# Patient Record
Sex: Female | Born: 1950
Health system: Southern US, Community
[De-identification: ages and names within clinical notes are randomized; demographics above are authoritative.]

## PROBLEM LIST (undated history)

## (undated) DIAGNOSIS — D333 Benign neoplasm of cranial nerves: Secondary | ICD-10-CM

## (undated) DIAGNOSIS — J302 Other seasonal allergic rhinitis: Secondary | ICD-10-CM

## (undated) DIAGNOSIS — N183 Chronic kidney disease, stage 3 unspecified: Secondary | ICD-10-CM

## (undated) DIAGNOSIS — R251 Tremor, unspecified: Secondary | ICD-10-CM

## (undated) DIAGNOSIS — K603 Anal fistula, unspecified: Secondary | ICD-10-CM

## (undated) DIAGNOSIS — N83202 Unspecified ovarian cyst, left side: Secondary | ICD-10-CM

## (undated) DIAGNOSIS — H269 Unspecified cataract: Secondary | ICD-10-CM

## (undated) DIAGNOSIS — R531 Weakness: Secondary | ICD-10-CM

## (undated) DIAGNOSIS — Z86010 Personal history of colonic polyps: Secondary | ICD-10-CM

## (undated) DIAGNOSIS — M109 Gout, unspecified: Secondary | ICD-10-CM

## (undated) DIAGNOSIS — G500 Trigeminal neuralgia: Secondary | ICD-10-CM

## (undated) DIAGNOSIS — I10 Essential (primary) hypertension: Secondary | ICD-10-CM

## (undated) DIAGNOSIS — H9191 Unspecified hearing loss, right ear: Secondary | ICD-10-CM

## (undated) DIAGNOSIS — E785 Hyperlipidemia, unspecified: Secondary | ICD-10-CM

## (undated) DIAGNOSIS — R2689 Other abnormalities of gait and mobility: Secondary | ICD-10-CM

## (undated) DIAGNOSIS — T7840XA Allergy, unspecified, initial encounter: Secondary | ICD-10-CM

## (undated) DIAGNOSIS — F419 Anxiety disorder, unspecified: Secondary | ICD-10-CM

## (undated) DIAGNOSIS — R32 Unspecified urinary incontinence: Secondary | ICD-10-CM

## (undated) DIAGNOSIS — K219 Gastro-esophageal reflux disease without esophagitis: Secondary | ICD-10-CM

## (undated) DIAGNOSIS — E079 Disorder of thyroid, unspecified: Secondary | ICD-10-CM

## (undated) DIAGNOSIS — K589 Irritable bowel syndrome without diarrhea: Secondary | ICD-10-CM

## (undated) HISTORY — DX: Other abnormalities of gait and mobility: R26.89

## (undated) HISTORY — DX: Personal history of colonic polyps: Z86.010

## (undated) HISTORY — DX: Gastro-esophageal reflux disease without esophagitis: K21.9

## (undated) HISTORY — PX: CATARACT EXTRACTION W/ INTRAOCULAR LENS  IMPLANT, BILATERAL: SHX1307

## (undated) HISTORY — PX: COLONOSCOPY WITH PROPOFOL: SHX5780

## (undated) HISTORY — DX: Unspecified cataract: H26.9

## (undated) HISTORY — DX: Allergy, unspecified, initial encounter: T78.40XA

## (undated) HISTORY — PX: WISDOM TOOTH EXTRACTION: SHX21

## (undated) HISTORY — DX: Unspecified hearing loss, right ear: H91.91

## (undated) HISTORY — PX: OTHER SURGICAL HISTORY: SHX169

## (undated) HISTORY — PX: MOUTH SURGERY: SHX715

## (undated) HISTORY — DX: Essential (primary) hypertension: I10

## (undated) HISTORY — DX: Gout, unspecified: M10.9

## (undated) HISTORY — DX: Anxiety disorder, unspecified: F41.9

## (undated) HISTORY — DX: Unspecified urinary incontinence: R32

## (undated) HISTORY — DX: Hyperlipidemia, unspecified: E78.5

## (undated) HISTORY — DX: Disorder of thyroid, unspecified: E07.9

---

## 1998-12-17 ENCOUNTER — Encounter: Admission: RE | Admit: 1998-12-17 | Discharge: 1998-12-17 | Payer: Self-pay | Admitting: Obstetrics and Gynecology

## 1998-12-17 ENCOUNTER — Encounter: Payer: Self-pay | Admitting: Obstetrics and Gynecology

## 1999-12-19 ENCOUNTER — Encounter: Admission: RE | Admit: 1999-12-19 | Discharge: 1999-12-19 | Payer: Self-pay | Admitting: Obstetrics and Gynecology

## 1999-12-19 ENCOUNTER — Encounter: Payer: Self-pay | Admitting: Obstetrics and Gynecology

## 2000-12-19 ENCOUNTER — Encounter: Admission: RE | Admit: 2000-12-19 | Discharge: 2000-12-19 | Payer: Self-pay | Admitting: Obstetrics and Gynecology

## 2000-12-19 ENCOUNTER — Encounter: Payer: Self-pay | Admitting: Obstetrics and Gynecology

## 2001-12-20 ENCOUNTER — Encounter: Admission: RE | Admit: 2001-12-20 | Discharge: 2001-12-20 | Payer: Self-pay | Admitting: Obstetrics and Gynecology

## 2001-12-20 ENCOUNTER — Encounter: Payer: Self-pay | Admitting: Obstetrics and Gynecology

## 2008-01-17 ENCOUNTER — Encounter: Admission: RE | Admit: 2008-01-17 | Discharge: 2008-01-17 | Payer: Self-pay | Admitting: Otolaryngology

## 2008-08-20 ENCOUNTER — Inpatient Hospital Stay (HOSPITAL_COMMUNITY): Admission: RE | Admit: 2008-08-20 | Discharge: 2008-08-24 | Payer: Self-pay | Admitting: Neurological Surgery

## 2008-08-20 ENCOUNTER — Encounter (INDEPENDENT_AMBULATORY_CARE_PROVIDER_SITE_OTHER): Payer: Self-pay | Admitting: Neurological Surgery

## 2008-08-20 DIAGNOSIS — D333 Benign neoplasm of cranial nerves: Secondary | ICD-10-CM

## 2008-08-20 HISTORY — DX: Benign neoplasm of cranial nerves: D33.3

## 2008-09-21 ENCOUNTER — Encounter: Admission: RE | Admit: 2008-09-21 | Discharge: 2008-09-21 | Payer: Self-pay | Admitting: Family Medicine

## 2010-05-29 LAB — TYPE AND SCREEN

## 2010-05-29 LAB — POCT I-STAT 4, (NA,K, GLUC, HGB,HCT)
Glucose, Bld: 141 mg/dL — ABNORMAL HIGH (ref 70–99)
HCT: 40 % (ref 36.0–46.0)
Hemoglobin: 13.6 g/dL (ref 12.0–15.0)
Potassium: 3 mEq/L — ABNORMAL LOW (ref 3.5–5.1)

## 2010-05-29 LAB — ABO/RH: ABO/RH(D): O POS

## 2010-05-30 LAB — BASIC METABOLIC PANEL
BUN: 18 mg/dL (ref 6–23)
Calcium: 9.7 mg/dL (ref 8.4–10.5)
Creatinine, Ser: 1.15 mg/dL (ref 0.4–1.2)
GFR calc Af Amer: 59 mL/min — ABNORMAL LOW (ref >60)

## 2010-05-30 LAB — CBC
Platelets: 238 10*3/uL (ref 150–400)
RBC: 4.78 MIL/uL (ref 3.87–5.11)
WBC: 8.2 10*3/uL (ref 4.0–10.5)

## 2010-07-05 NOTE — Op Note (Signed)
Doris French, Doris French                ACCOUNT NO.:  [id number]   MEDICAL RECORD NO.:  [id number]          PATIENT TYPE:  INP   LOCATION:  3109                         FACILITY:  [facility]   PHYSICIAN:  Newman Pies, MD            DATE OF BIRTH:  23-Dec-1950   DATE OF PROCEDURE:  08/20/2008  DATE OF DISCHARGE:                               OPERATIVE REPORT   SURGEON:  Newman Pies, MD   CO-SURGEON:  Stefani Dama, MD   PREOPERATIVE DIAGNOSIS:  Right cerebellopontine angle tumor.   POSTOPERATIVE DIAGNOSIS:  Right cerebellopontine angle tumor.   PROCEDURE PERFORMED:  1. Excision of the right cerebellopontine angle tumor.  2. Microscope microdiessection.  3. NIMS facial nerve monitoring.   ANESTHESIA:  General endotracheal tube anesthesia.   COMPLICATIONS:  None.   INDICATIONS FOR PROCEDURE:  The patient is a 60 year old female with a  history of an asymmetric right-sided sensorineural hearing loss and  tinnitus.  An MRI scan showed right-sided cerebellopontine angle tumor,  likely an acoustic neuroma.  The tumor was noted to have an  extracanalicular and intracanalicular components.  According to the  patient, her right-sided hearing loss has progressively worsened.  Based  on the above findings, the decision was made to perform excision of the  tumor.  The alternative of radiation therapy and conservative  observation were also discussed.  The patient is interested in  proceeding with the surgical procedure.  The procedure is performed in  conjunction with Dr. Barnett Abu, neurosurgeon.   DESCRIPTION:  The patient was taken to the operating room and placed  supine on the operating table.  The initial part of the procedure was  performed by Dr. Danielle Dess.  It included the exposure of the posterior  cranial fossa via retrosigmoid craniotomy.  The posterior cranial fossa  was exposed, revealing a mass occupying the superior and central portion  of the porus acusticus.  The mass was noted to be  significantly fibrotic  and adherent to the posterior cranial fossa.  Part of the posterior  cranial fossa anterior to the porus acusticus was noted to be eroded by  the tumor.  A portion of the tumor was removed and sent to the pathology  department for frozen section analysis.  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  However, definitive diagnosis could not be made  until the immunohistologic staining was performed.  The dura overlying  the posterior cranial fossa was then carefully dissected free from its  bony attachment.  Using a 3-0 diamond bur, the bony encasement of the  internal auditory canal was carefully drilled out.  As noted earlier,  portion of the posterior cranial fossa anterior to the internal auditory  canal was eroded by the tumor.  The tumor was then carefully dissected  free from the surrounding dura tissue.  The facial nerve was  subsequently identified and preserved.  The tumor was carefully  dissected free from the facial nerve.  However, most of the cranial  nerve #8 was noted to be adherent to the tumor.  The entire tumor was  subsequently removed from both the intracanalicular and extracanalicular  portions.  The specimen was sent to the Pathology Department for  permanent histologic identification.  Please see Dr. Verlee Rossetti dictation  regarding surgical closure of the dura and incisions.   OPERATIVE FINDINGS:  Fibrous and erosive tumor was noted within the  internal auditory canal as well as the posterior cranial fossa.  The  tumor was resected and sent to the Pathology Department for permanent  histologic identification.   SPECIMEN:  Right cerebellopontine angle tumor.   FOLLOWUP CARE:  The patient will be admitted to the Neurosurgical  Intensive Care Unit for postop hospital care.      Newman Pies, MD  Electronically Signed     ST/MEDQ  D:  08/20/2008  T:  08/21/2008  Job:  [id number]

## 2010-07-05 NOTE — Op Note (Signed)
Doris French, CHANCELLOR                ACCOUNT NO.:  [id number]   MEDICAL RECORD NO.:  [id number]          PATIENT TYPE:  INP   LOCATION:  3109                         FACILITY:  [facility]   PHYSICIAN:  Stefani Dama, M.D.  DATE OF BIRTH:  09/01/1950   DATE OF PROCEDURE:  08/20/2008  DATE OF DISCHARGE:                               OPERATIVE REPORT   PREOPERATIVE DIAGNOSIS:  Right internal acoustic meatus tumor.   POSTOPERATIVE DIAGNOSIS:  Right internal acoustic meatus tumor.   PROCEDURES:  Right suboccipital craniectomy, gross total resection of  tumor with drilling of the internal acoustic meatus.   SURGEON:  Stefani Dama, MD   CO-SURGEON:  Newman Pies, MD   ASSISTANT:  Coletta Memos, MD   ANESTHESIA:  General endotracheal.   INDICATIONS:  Doris French is a 60 year old individual who has had  significant hearing loss in her right ear.  She was found to have a  tumor in the internal acoustic meatus with extension intracranially and  this was felt to be representative of a schwannoma; however, the  diagnosis also includes differential of meningioma.  She is taken to the  operating room at this time to undergo surgical resection.   PROCEDURE:  The patient was brought to the operating room supine on the  stretcher after smooth induction of general endotracheal anesthesia and  placement of appropriate monitoring lines including seventh and eighth  nerve monitoring.  She was placed in a three-pin headrest, turned into  the park position on the right side up, the left side down, and the  suboccipital region was then prepped with alcohol and DuraPrep after  shaving appropriate area of skin with clippers.  The patient was then  prepped and draped sterilely and a vertical incision was made in the  right retromastoid region, carried down to the occiput until the region  of the mastoid air cells could be identified.  Once these was identified  and the tissue was stripped from the bone in  this area, high-speed drill  was used to create a craniectomy in the corner of the mastoid at the  region of the junction of the transverse and sigmoid sinuses.  The  craniectomy was enlarged.  When the sigmoid sinus was clearly  identified, the mastoid air cells were packed with bone wax and then the  dura was opened in a T-shape with a short leg of the T being based into  the tangle of the transverse and sigmoid sinus junction.  The dura was  tacked up and the lateral aspect of the cerebellum was then retracted  medially after placement of a Budde Halo retractor system and the area  was then inspected under microscope.  With microscopic visualization,  Dr. Suszanne Conners and I could see that the area where we would expect to find the  internal acoustic meatus was enlarged with a mass.  Mass was adherent to  it.  Careful inspection at this point was somewhat confusing as the area  of the fifth nerve could not be identified.  At this point, I asked Dr.  Franky Macho to be  present to help with some localization and with further  retraction along the inferior aspect of the mass, we could identify what  we felt was the seventh and eighth nerve complex.  Ultimately  stimulation of this yielded the seventh nerve stimulation; however,  brainstem auditory evoke responses were never able to be obtained even  with the patient awake prior to being put to sleep and were not present  once the patient was asleep, therefore brain stem auditory evoked  monitoring was not performed.  Further dissection then with the seventh  nerve identified positively identified the ninth, tenth, and eleventh  nerve complex and this was carefully protected with cottonoid patty.  Spinal fluid was being evacuated to allow retraction of the lateral  aspect of the cerebellum and we could see the lateral aspect of the  brain stem.  The fifth nerve, however, could not be identified as it was  obscured by the mass from the internal auditory  meatus.  At this point,  biopsy of some soft tissue attached to the mass was obtained and this  was returned as a spindle cell tumor with the seventh and eighth nerve  complex being identified.  Dr. Suszanne Conners started to drill the portion of the  internal acoustic meatus.  This released some lateral tumor and  gradually we were able to mobilize a portion of the tumor from the  posterior and superior portion of the meatus.  I resected this using  microdissection technique and further drilling of the acoustic meatus  was performed by Dr. Suszanne Conners.  It was felt because the tumor was very  fibrous and very densely adherent to the bone that we may not be able to  remove it and decompress fully the seventh nerve; however, once the  internal auditory meatus was drilled away, I was able to tease some of  the tumor from the outside that is the intracranial portion and then by  gently dissecting around the bony border of the internal auditory  meatus, a large portion of tumor released itself in a singular piece.  This included a piece extended into the internal auditory meatus and  with lifting and removal of this tumor, we could see clearly the path of  the seventh nerve, which was intact.  We stimulated the seventh nerve,  which responded briskly and at this point further exploration yielded  some small packs of tumor in the internal auditory meatus, which were  removed with a simple forceps and further exploration found that the  external portion of the tumor that is tumor that was intracranial was  removed completely and no other tags were identified.  With this, the  wound was irrigated copiously.  Hemostasis from the bone, which had some  bleeding and it was obtained with some application of Gelfoam pledgets,  which were later irrigated away.  In the end, we had good stimulation of  the seventh nerve and a gross total resection of the tumor and at this  point we could further identify beyond the mass  that the fifth nerve was  intact and easily identifiable at this point.  The retractors were then  removed.  Careful hemostasis was obtained in all the intracranial  aspects and the dura was reapproximated with singular 4-0 Nurolon  sutures and DuraGen was placed in a sandwich configuration on either  side of the dura.  The suboccipital region was then closed with 2-0  Vicryl in interrupted fashion in the retromastoid fascia and tissue  and  the subcutaneous skin was closed with 2-0 Vicryl and 3-0 nylon was used  to close the skin.  The patient tolerated the procedure well.  Blood  loss was estimated at 350 mL.  She was returned to recovery room in  stable condition.      Stefani Dama, M.D.  Electronically Signed     Stefani Dama, M.D.  Electronically Signed    HJE/MEDQ  D:  08/20/2008  T:  08/21/2008  Job:  [id number]

## 2010-07-05 NOTE — Discharge Summary (Signed)
Doris French, Doris French                ACCOUNT NO.:  [id number]   MEDICAL RECORD NO.:  [id number]          PATIENT TYPE:  INP   LOCATION:  3040                         FACILITY:  [facility]   PHYSICIAN:  Payton Doughty, M.D.      DATE OF BIRTH:  01/06/1951   DATE OF ADMISSION:  08/20/2008  DATE OF DISCHARGE:  08/24/2008                               DISCHARGE SUMMARY   ADMITTING DIAGNOSIS:  Right acoustic neuroma.   DISCHARGE DIAGNOSIS:  Right acoustic neuroma.   PROCEDURE:  Right retromastoid suboccipital craniectomy for meningioma.   ATTENDING DOCTOR:  Stefani Dama, MD   COMPLICATIONS:  None.   DISCHARGE STATUS:  Alive and well.   BODY OF TEXT:  A 60 year old right-handed white girl's history and  physical is recounted in the chart.  She enjoys good health.  Acoustic  neuroma was discovered.  She was followed by Dr. Danielle Dess.  On August 20, 2008, she was taken by Dr. Danielle Dess to the OR and Dr. Suszanne Conners and underwent a  suboccipital craniectomy for tumor resection.  Postoperatively, she has  done well.  She experienced some vertigo.  She had no CSF leak.  She was  in the unit for couple of days, then was out, and still had some  vertigo, so she was started on physical therapy, we got her a rolling  walker, which has markedly improved her walking.  Currently, she is up  and about.  She has no dizziness.  She has a walker for use at home.  She is going to see Dr. Danielle Dess back in a couple of weeks and is using a  little bit of Lortab for pain.           ______________________________  Payton Doughty, M.D.     MWR/MEDQ  D:  08/24/2008  T:  08/25/2008  Job:  [id number]

## 2011-09-22 ENCOUNTER — Encounter: Payer: Self-pay | Admitting: Internal Medicine

## 2011-09-22 ENCOUNTER — Telehealth: Payer: Self-pay | Admitting: *Deleted

## 2011-09-22 NOTE — Telephone Encounter (Signed)
Patient had a colonoscopy in 11/2001.  I have scheduled her a colon foro 11/15/11  And pre-visit for 10/31/11

## 2011-11-06 ENCOUNTER — Ambulatory Visit (AMBULATORY_SURGERY_CENTER): Payer: Medicare Other | Admitting: *Deleted

## 2011-11-06 VITALS — Ht 65.25 in | Wt 212.1 lb

## 2011-11-06 DIAGNOSIS — Z1211 Encounter for screening for malignant neoplasm of colon: Secondary | ICD-10-CM

## 2011-11-06 MED ORDER — NA SULFATE-K SULFATE-MG SULF 17.5-3.13-1.6 GM/177ML PO SOLN: 1.0000 | Freq: Once | ORAL | Status: DC

## 2011-11-07 ENCOUNTER — Encounter: Payer: Self-pay | Admitting: Internal Medicine

## 2011-11-13 ENCOUNTER — Telehealth: Payer: Self-pay

## 2011-11-13 MED ORDER — NA SULFATE-K SULFATE-MG SULF 17.5-3.13-1.6 GM/177ML PO SOLN: ORAL | Status: DC

## 2011-11-13 NOTE — Telephone Encounter (Signed)
Pt cannot afford suprep, cost$ 72.84.  I have put her a sample up front for pick up.  She said thanks.

## 2011-11-15 ENCOUNTER — Ambulatory Visit (AMBULATORY_SURGERY_CENTER): Payer: Medicare Other | Admitting: Internal Medicine

## 2011-11-15 ENCOUNTER — Encounter: Payer: Self-pay | Admitting: Internal Medicine

## 2011-11-15 VITALS — BP 140/79 | HR 78 | Temp 98.2°F | Resp 16 | Ht 65.0 in | Wt 212.0 lb

## 2011-11-15 DIAGNOSIS — D126 Benign neoplasm of colon, unspecified: Secondary | ICD-10-CM

## 2011-11-15 DIAGNOSIS — Z1211 Encounter for screening for malignant neoplasm of colon: Secondary | ICD-10-CM

## 2011-11-15 MED ORDER — SODIUM CHLORIDE 0.9 % IV SOLN: 500.0000 mL | INTRAVENOUS | Status: DC

## 2011-11-15 NOTE — Patient Instructions (Addendum)
Two benign-appearing polyps were removed today. The colon was otherwise normal. Great job on the prep!  I will send you a letter about pathology results and recommendations.  Thank you for choosing me and [facility].  Iva Boop, MD, FACG   YOU HAD AN ENDOSCOPIC PROCEDURE TODAY AT THE [facility]: Refer to the procedure report that was given to you for any specific questions about what was found during the examination.  If the procedure report does not answer your questions, please call your gastroenterologist to clarify.  If you requested that your care partner not be given the details of your procedure findings, then the procedure report has been included in a sealed envelope for you to review at your convenience later.  YOU SHOULD EXPECT: Some feelings of bloating in the abdomen. Passage of more gas than usual.  Walking can help get rid of the air that was put into your GI tract during the procedure and reduce the bloating. If you had a lower endoscopy (such as a colonoscopy or flexible sigmoidoscopy) you may notice spotting of blood in your stool or on the toilet paper. If you underwent a bowel prep for your procedure, then you may not have a normal bowel movement for a few days.  DIET: Your first meal following the procedure should be a light meal and then it is ok to progress to your normal diet.  A half-sandwich or bowl of soup is an example of a good first meal.  Heavy or fried foods are harder to digest and may make you feel nauseous or bloated.  Likewise meals heavy in dairy and vegetables can cause extra gas to form and this can also increase the bloating.  Drink plenty of fluids but you should avoid alcoholic beverages for 24 hours.  ACTIVITY: Your care partner should take you home directly after the procedure.  You should plan to take it easy, moving slowly for the rest of the day.  You can resume normal activity the day after the procedure however you  should NOT DRIVE or use heavy machinery for 24 hours (because of the sedation medicines used during the test).    SYMPTOMS TO REPORT IMMEDIATELY: A gastroenterologist can be reached at any hour.  During normal business hours, 8:30 AM to 5:00 PM Monday through Friday, call [phone].  After hours and on weekends, please call the GI answering service at [phone] who will take a message and have the physician on call contact you.   Following lower endoscopy (colonoscopy or flexible sigmoidoscopy):  Excessive amounts of blood in the stool  Significant tenderness or worsening of abdominal pains  Swelling of the abdomen that is new, acute  Fever of 100F or higher  FOLLOW UP: If any biopsies were taken you will be contacted by phone or by letter within the next 1-3 weeks.  Call your gastroenterologist if you have not heard about the biopsies in 3 weeks.  Our staff will call the home number listed on your records the next business day following your procedure to check on you and address any questions or concerns that you may have at that time regarding the information given to you following your procedure. This is a courtesy call and so if there is no answer at the home number and we have not heard from you through the emergency physician on call, we will assume that you have returned to your regular daily activities without incident.  SIGNATURES/CONFIDENTIALITY: You and/or your  care partner have signed paperwork which will be entered into your electronic medical record.  These signatures attest to the fact that that the information above on your After Visit Summary has been reviewed and is understood.  Full responsibility of the confidentiality of this discharge information lies with you and/or your care-partner.

## 2011-11-15 NOTE — Op Note (Signed)
[facility] [address] [place], [address]   COLONOSCOPY PROCEDURE REPORT  PATIENT: Doris French, Doris French  MR#: [id number] BIRTHDATE: 10-Apr-1950 , 61  yrs. old GENDER: Female ENDOSCOPIST: Iva Boop, MD, [facility] REFERRED BY: PROCEDURE DATE:  11/15/2011 PROCEDURE:   Colonoscopy with snare polypectomy ASA CLASS:   Class II INDICATIONS:average risk screening. MEDICATIONS: propofol (Diprivan) 250mg  IV, MAC sedation, administered by CRNA, and These medications were titrated to patient response per physician's verbal order  DESCRIPTION OF PROCEDURE:   After the risks benefits and alternatives of the procedure were thoroughly explained, informed consent was obtained.  A digital rectal exam revealed no abnormalities of the rectum.   The LB CF-[id number]  endoscope was introduced through the anus and advanced to the cecum, which was identified by both the appendix and ileocecal valve. No adverse events experienced.   The quality of the prep was Suprep excellent The instrument was then slowly withdrawn as the colon was fully examined.      COLON FINDINGS: Two polypoid shaped sessile polyps measuring 4-7 mm in size were found in the transverse colon and descending colon.  A polypectomy was performed with a cold snare.  The resection was complete and the polyp tissue was completely retrieved.   Small internal hemorrhoids were found.   The colon mucosa was otherwise normal. Right colon retroflexion performed.  Retroflexed views revealed internal hemorrhoids. The time to cecum=1 minutes 42 seconds.  Withdrawal time=11 minutes 50 seconds.  The scope was withdrawn and the procedure completed. COMPLICATIONS: There were no complications.  ENDOSCOPIC IMPRESSION: 1.   Two sessile polyps measuring 4-7 mm in size were found in the transverse colon and descending colon; polypectomy was performed with a cold snare 2.   Small internal hemorrhoids 3.   The colon mucosa was  otherwise normal with excellent prep  RECOMMENDATIONS: Timing of repeat colonoscopy will be determined by pathology findings.   eSigned:  Iva Boop, MD, [facility] 11/15/2011 9:08 AM cc: Gildardo Cranker MD and The Patient

## 2011-11-15 NOTE — Progress Notes (Signed)
Patient did not have preoperative order for IV antibiotic SSI prophylaxis. (G8918)  Patient did not experience any of the following events: a burn prior to discharge; a fall within the facility; wrong site/side/patient/procedure/implant event; or a hospital transfer or hospital admission upon discharge from the facility. (G8907)  

## 2011-11-16 ENCOUNTER — Telehealth: Payer: Self-pay | Admitting: *Deleted

## 2011-11-16 NOTE — Telephone Encounter (Signed)
  Follow up Call-  Call back number 11/15/2011  Post procedure Call Back phone  # [phone]  Permission to leave phone message Yes     Patient questions:  Do you have a fever, pain , or abdominal swelling? no Pain Score  0 *  Have you tolerated food without any problems? yes  Have you been able to return to your normal activities? yes  Do you have any questions about your discharge instructions: Diet   no Medications  no Follow up visit  no  Do you have questions or concerns about your Care? no  Actions: * If pain score is 4 or above: No action needed, pain <4.

## 2011-11-21 ENCOUNTER — Encounter: Payer: Self-pay | Admitting: Internal Medicine

## 2011-11-21 DIAGNOSIS — Z860100 Personal history of colon polyps, unspecified: Secondary | ICD-10-CM

## 2011-11-21 HISTORY — DX: Personal history of colonic polyps: Z86.010

## 2011-11-21 HISTORY — DX: Personal history of colon polyps, unspecified: Z86.0100

## 2011-11-21 NOTE — Progress Notes (Signed)
Quick Note:  2 adenomas max 7 mm Repeat colonoscopy about 11/2016 ______

## 2011-12-19 ENCOUNTER — Other Ambulatory Visit: Payer: Self-pay | Admitting: Family Medicine

## 2011-12-19 DIAGNOSIS — Z1231 Encounter for screening mammogram for malignant neoplasm of breast: Secondary | ICD-10-CM

## 2012-01-29 ENCOUNTER — Ambulatory Visit: Payer: Medicare Other

## 2012-02-26 ENCOUNTER — Ambulatory Visit
Admission: RE | Admit: 2012-02-26 | Discharge: 2012-02-26 | Disposition: A | Discharge status: Home / Self Care | Payer: Medicare Other | Source: Ambulatory Visit | Attending: Family Medicine | Admitting: Family Medicine

## 2012-02-26 DIAGNOSIS — Z1231 Encounter for screening mammogram for malignant neoplasm of breast: Secondary | ICD-10-CM

## 2012-04-14 ENCOUNTER — Ambulatory Visit (HOSPITAL_BASED_OUTPATIENT_CLINIC_OR_DEPARTMENT_OTHER): Payer: Medicare Other | Attending: Otolaryngology

## 2012-04-14 VITALS — Ht 64.0 in | Wt 220.0 lb

## 2012-04-14 DIAGNOSIS — G4700 Insomnia, unspecified: Secondary | ICD-10-CM | POA: Insufficient documentation

## 2012-04-14 DIAGNOSIS — G4733 Obstructive sleep apnea (adult) (pediatric): Secondary | ICD-10-CM

## 2012-04-20 DIAGNOSIS — R0989 Other specified symptoms and signs involving the circulatory and respiratory systems: Secondary | ICD-10-CM

## 2012-04-20 DIAGNOSIS — G4730 Sleep apnea, unspecified: Secondary | ICD-10-CM

## 2012-04-20 DIAGNOSIS — R0609 Other forms of dyspnea: Secondary | ICD-10-CM

## 2012-04-20 DIAGNOSIS — G4700 Insomnia, unspecified: Secondary | ICD-10-CM

## 2012-04-20 NOTE — Procedures (Signed)
Doris French, Doris French                ACCOUNT NO.:  [id number]  MEDICAL RECORD NO.:  [id number]          PATIENT TYPE:  OUT  LOCATION:  SLEEP CENTER                 FACILITY:  [facility]  PHYSICIAN:  Clinton D. Maple Hudson, MD, FCCP, FACPDATE OF BIRTH:  06[birth year]18-1950  DATE OF STUDY:  04/14/2012                           NOCTURNAL POLYSOMNOGRAM  REFERRING PHYSICIAN:  Jefry H. Pollyann Kennedy, MD  INDICATION FOR STUDY:  Insomnia with sleep apnea.  EPWORTH SLEEPINESS SCORE:  10/24.  BMI 37.8, weight 220 pounds.  Height 64 inches.  Neck 15 inches.  MEDICATIONS:  Home medications are charted and reviewed.  SLEEP ARCHITECTURE:  Total sleep time 78 minutes with sleep efficiency 19.3%.  Stage I was 3.2%, stage II 78.2%, stage III 18.6%.  REM absent. Sleep latency 222.5 minutes, REM latency NA.  Awake after sleep onset 46.5 minutes.  Arousal index 8.5 minutes.  Bedtime medication:  None.  Sleep onset was at 1:45 a.m., but she was then awake again from approximately 2:10 a.m. until 2:50 a.m. before regaining sleep.  She woke finally, a little before 4:00 a.m.  RESPIRATORY DATA:  Apnea-hypopnea index (AHI) 0 per hour.  No events were scored as respiratory sleep disturbance.  OXYGEN DATA:  Moderate snoring with oxygen desaturation to a nadir of 92% and mean oxygen saturation through the study of 94.7% on room air.  CARDIAC DATA:  Sinus rhythm with occasional PAC.  MOVEMENT/PARASOMNIA:  No significant movement disturbance.  Bathroom x2.  IMPRESSION/RECOMMENDATION: 1. Significant difficulty initiating and maintaining sleep.  No     bedtime medication:  Sleep onset finally a little after 1:30 a.m.,     awake again at approximately 2:30 a.m. and then awake again     from shortly before 4:00 a.m. onwards. 2. No respiratory events with sleep disturbance, within normal limits.     AHI 0 per hour.  Moderate snoring with oxygen desaturation to a     nadir of 92% and mean oxygen saturation through the study of 94.7%    on room air.     Clinton D. Maple Hudson, MD, [facility], FACP Diplomate, American Board of Sleep Medicine    CDY/MEDQ  D:  04/20/2012 09:44:35  T:  04/20/2012 22:00:37  Job:  [id number]

## 2012-07-25 ENCOUNTER — Other Ambulatory Visit: Payer: Self-pay | Admitting: Neurological Surgery

## 2012-07-25 DIAGNOSIS — D320 Benign neoplasm of cerebral meninges: Secondary | ICD-10-CM

## 2012-07-26 ENCOUNTER — Ambulatory Visit
Admission: RE | Admit: 2012-07-26 | Discharge: 2012-07-26 | Disposition: A | Discharge status: Home / Self Care | Payer: Medicare Other | Source: Ambulatory Visit | Attending: Neurological Surgery | Admitting: Neurological Surgery

## 2012-07-26 DIAGNOSIS — D320 Benign neoplasm of cerebral meninges: Secondary | ICD-10-CM

## 2012-07-26 MED ORDER — GADOBENATE DIMEGLUMINE 529 MG/ML IV SOLN
20.0000 mL | Freq: Once | INTRAVENOUS | Status: AC | PRN
  Administered 2012-07-26: 20 mL via INTRAVENOUS

## 2013-01-30 ENCOUNTER — Other Ambulatory Visit: Payer: Self-pay

## 2013-01-30 DIAGNOSIS — Z1231 Encounter for screening mammogram for malignant neoplasm of breast: Secondary | ICD-10-CM

## 2013-03-05 ENCOUNTER — Ambulatory Visit
Admission: RE | Admit: 2013-03-05 | Discharge: 2013-03-05 | Disposition: A | Discharge status: Home / Self Care | Payer: Medicare Other | Source: Ambulatory Visit

## 2013-03-05 DIAGNOSIS — Z1231 Encounter for screening mammogram for malignant neoplasm of breast: Secondary | ICD-10-CM

## 2014-01-05 ENCOUNTER — Other Ambulatory Visit: Payer: Self-pay

## 2014-01-05 DIAGNOSIS — Z1231 Encounter for screening mammogram for malignant neoplasm of breast: Secondary | ICD-10-CM

## 2014-03-09 ENCOUNTER — Ambulatory Visit
Admission: RE | Admit: 2014-03-09 | Discharge: 2014-03-09 | Disposition: A | Discharge status: Home / Self Care | Payer: Medicare Other | Source: Ambulatory Visit

## 2014-03-09 DIAGNOSIS — Z1231 Encounter for screening mammogram for malignant neoplasm of breast: Secondary | ICD-10-CM

## 2014-04-02 ENCOUNTER — Other Ambulatory Visit: Payer: Self-pay | Admitting: Obstetrics and Gynecology

## 2014-04-03 LAB — CYTOLOGY - PAP

## 2015-02-23 ENCOUNTER — Other Ambulatory Visit: Payer: Self-pay

## 2015-02-23 DIAGNOSIS — Z1231 Encounter for screening mammogram for malignant neoplasm of breast: Secondary | ICD-10-CM

## 2015-03-15 ENCOUNTER — Ambulatory Visit
Admission: RE | Admit: 2015-03-15 | Discharge: 2015-03-15 | Disposition: A | Discharge status: Home / Self Care | Payer: Medicare Other | Source: Ambulatory Visit

## 2015-03-15 DIAGNOSIS — Z1231 Encounter for screening mammogram for malignant neoplasm of breast: Secondary | ICD-10-CM

## 2016-01-04 ENCOUNTER — Other Ambulatory Visit: Payer: Self-pay | Admitting: Obstetrics and Gynecology

## 2016-01-04 DIAGNOSIS — Z1231 Encounter for screening mammogram for malignant neoplasm of breast: Secondary | ICD-10-CM

## 2016-03-16 ENCOUNTER — Ambulatory Visit
Admission: RE | Admit: 2016-03-16 | Discharge: 2016-03-16 | Disposition: A | Discharge status: Home / Self Care | Payer: Medicare Other | Source: Ambulatory Visit | Attending: Obstetrics and Gynecology | Admitting: Obstetrics and Gynecology

## 2016-03-16 DIAGNOSIS — Z1231 Encounter for screening mammogram for malignant neoplasm of breast: Secondary | ICD-10-CM

## 2017-01-04 ENCOUNTER — Encounter: Payer: Self-pay | Admitting: Internal Medicine

## 2017-01-04 ENCOUNTER — Other Ambulatory Visit: Payer: Self-pay | Admitting: Family Medicine

## 2017-01-04 ENCOUNTER — Other Ambulatory Visit: Payer: Self-pay | Admitting: Obstetrics and Gynecology

## 2017-01-04 DIAGNOSIS — Z1231 Encounter for screening mammogram for malignant neoplasm of breast: Secondary | ICD-10-CM

## 2017-02-20 HISTORY — PX: EYE SURGERY: SHX253

## 2017-02-22 DIAGNOSIS — M792 Neuralgia and neuritis, unspecified: Secondary | ICD-10-CM | POA: Diagnosis not present

## 2017-03-19 ENCOUNTER — Ambulatory Visit
Admission: RE | Admit: 2017-03-19 | Discharge: 2017-03-19 | Disposition: A | Discharge status: Home / Self Care | Payer: Medicare HMO | Source: Ambulatory Visit | Attending: Family Medicine | Admitting: Family Medicine

## 2017-03-19 DIAGNOSIS — Z1231 Encounter for screening mammogram for malignant neoplasm of breast: Secondary | ICD-10-CM

## 2017-03-22 DIAGNOSIS — R51 Headache: Secondary | ICD-10-CM | POA: Diagnosis not present

## 2017-03-22 DIAGNOSIS — Z6841 Body Mass Index (BMI) 40.0 and over, adult: Secondary | ICD-10-CM | POA: Diagnosis not present

## 2017-03-22 DIAGNOSIS — I10 Essential (primary) hypertension: Secondary | ICD-10-CM | POA: Diagnosis not present

## 2017-03-30 DIAGNOSIS — R51 Headache: Secondary | ICD-10-CM | POA: Diagnosis not present

## 2017-04-03 DIAGNOSIS — R200 Anesthesia of skin: Secondary | ICD-10-CM | POA: Diagnosis not present

## 2017-04-03 DIAGNOSIS — R51 Headache: Secondary | ICD-10-CM | POA: Diagnosis not present

## 2017-04-05 DIAGNOSIS — Z6841 Body Mass Index (BMI) 40.0 and over, adult: Secondary | ICD-10-CM | POA: Diagnosis not present

## 2017-04-05 DIAGNOSIS — I10 Essential (primary) hypertension: Secondary | ICD-10-CM | POA: Diagnosis not present

## 2017-04-05 DIAGNOSIS — G500 Trigeminal neuralgia: Secondary | ICD-10-CM | POA: Diagnosis not present

## 2017-04-10 DIAGNOSIS — Z01419 Encounter for gynecological examination (general) (routine) without abnormal findings: Secondary | ICD-10-CM | POA: Diagnosis not present

## 2017-04-10 DIAGNOSIS — L02215 Cutaneous abscess of perineum: Secondary | ICD-10-CM | POA: Diagnosis not present

## 2017-04-10 DIAGNOSIS — Z6841 Body Mass Index (BMI) 40.0 and over, adult: Secondary | ICD-10-CM | POA: Diagnosis not present

## 2017-05-18 ENCOUNTER — Encounter: Payer: Self-pay | Admitting: Internal Medicine

## 2017-06-11 DIAGNOSIS — H25813 Combined forms of age-related cataract, bilateral: Secondary | ICD-10-CM | POA: Diagnosis not present

## 2017-06-11 DIAGNOSIS — H5213 Myopia, bilateral: Secondary | ICD-10-CM | POA: Diagnosis not present

## 2017-06-11 DIAGNOSIS — H16103 Unspecified superficial keratitis, bilateral: Secondary | ICD-10-CM | POA: Diagnosis not present

## 2017-06-11 DIAGNOSIS — H04123 Dry eye syndrome of bilateral lacrimal glands: Secondary | ICD-10-CM | POA: Diagnosis not present

## 2017-06-26 DIAGNOSIS — H25811 Combined forms of age-related cataract, right eye: Secondary | ICD-10-CM | POA: Diagnosis not present

## 2017-07-03 DIAGNOSIS — H25812 Combined forms of age-related cataract, left eye: Secondary | ICD-10-CM | POA: Diagnosis not present

## 2017-07-04 DIAGNOSIS — Z6841 Body Mass Index (BMI) 40.0 and over, adult: Secondary | ICD-10-CM | POA: Diagnosis not present

## 2017-07-04 DIAGNOSIS — Z0000 Encounter for general adult medical examination without abnormal findings: Secondary | ICD-10-CM | POA: Diagnosis not present

## 2017-07-04 DIAGNOSIS — G500 Trigeminal neuralgia: Secondary | ICD-10-CM | POA: Diagnosis not present

## 2017-07-04 DIAGNOSIS — I10 Essential (primary) hypertension: Secondary | ICD-10-CM | POA: Diagnosis not present

## 2017-07-04 DIAGNOSIS — E559 Vitamin D deficiency, unspecified: Secondary | ICD-10-CM | POA: Diagnosis not present

## 2017-07-04 DIAGNOSIS — N183 Chronic kidney disease, stage 3 (moderate): Secondary | ICD-10-CM | POA: Diagnosis not present

## 2017-07-04 DIAGNOSIS — Z23 Encounter for immunization: Secondary | ICD-10-CM | POA: Diagnosis not present

## 2017-07-04 DIAGNOSIS — M109 Gout, unspecified: Secondary | ICD-10-CM | POA: Diagnosis not present

## 2017-07-04 DIAGNOSIS — E7800 Pure hypercholesterolemia, unspecified: Secondary | ICD-10-CM | POA: Diagnosis not present

## 2017-07-18 ENCOUNTER — Other Ambulatory Visit: Payer: Self-pay

## 2017-07-18 ENCOUNTER — Ambulatory Visit (AMBULATORY_SURGERY_CENTER): Payer: Self-pay | Admitting: *Deleted

## 2017-07-18 VITALS — Ht 64.5 in | Wt 260.0 lb

## 2017-07-18 DIAGNOSIS — Z86010 Personal history of colonic polyps: Secondary | ICD-10-CM

## 2017-07-18 NOTE — Progress Notes (Signed)
No egg or soy allergy known to patient  No issues with past sedation with any surgeries  or procedures, no intubation problems  No diet pills per patient No home 02 use per patient  No blood thinners per patient  Pt denies issues with constipation  No A fib or A flutter  EMMI video sent to pt's e mail -pt has no e mail she can use  Pt concerned about a possible rectocele- has had a perineum biopsy that was scar tissue and inflammation

## 2017-07-20 ENCOUNTER — Encounter: Payer: Self-pay | Admitting: Internal Medicine

## 2017-08-02 ENCOUNTER — Encounter: Payer: Self-pay | Admitting: Internal Medicine

## 2017-08-02 ENCOUNTER — Other Ambulatory Visit: Payer: Self-pay

## 2017-08-02 ENCOUNTER — Ambulatory Visit (AMBULATORY_SURGERY_CENTER): Payer: Medicare HMO | Admitting: Internal Medicine

## 2017-08-02 ENCOUNTER — Telehealth: Payer: Self-pay

## 2017-08-02 VITALS — BP 120/64 | HR 67 | Temp 97.7°F | Resp 14 | Ht 64.5 in | Wt 260.0 lb

## 2017-08-02 DIAGNOSIS — Z6841 Body Mass Index (BMI) 40.0 and over, adult: Secondary | ICD-10-CM | POA: Diagnosis not present

## 2017-08-02 DIAGNOSIS — D122 Benign neoplasm of ascending colon: Secondary | ICD-10-CM

## 2017-08-02 DIAGNOSIS — Z86010 Personal history of colonic polyps: Secondary | ICD-10-CM | POA: Diagnosis present

## 2017-08-02 DIAGNOSIS — I10 Essential (primary) hypertension: Secondary | ICD-10-CM | POA: Diagnosis not present

## 2017-08-02 DIAGNOSIS — N816 Rectocele: Secondary | ICD-10-CM

## 2017-08-02 DIAGNOSIS — K602 Anal fissure, unspecified: Secondary | ICD-10-CM | POA: Insufficient documentation

## 2017-08-02 MED ORDER — DILTIAZEM GEL 2 %: 1.0000 "application " | Freq: Two times a day (BID) | CUTANEOUS | 3 refills | Status: DC

## 2017-08-02 MED ORDER — SODIUM CHLORIDE 0.9 % IV SOLN: 500.0000 mL | Freq: Once | INTRAVENOUS | Status: DC

## 2017-08-02 NOTE — Patient Instructions (Addendum)
I found and removed one tiny polyp. I will let you know pathology results and when to have another routine colonoscopy by mail and/or My Chart. Should be 5 yrs at least.  You do have a rectocele and the rectal pain is likely coming from an anal fissure. They can be hard to see but it does sound like you have one.  We treat that with rectal cream. I have prescribed it. Please take the prescription to [facility] wher it is made.  You should return to gynecology about the rectocele and bleeding from perineum.  Please also schedule a follow-up with me about the rectal pain. Note - I did not see significant hemorrhoid issues  I appreciate the opportunity to care for you. Gatha Mayer, MD, [facility]  Polyp handout given to patient.  Resume previous diet. Continue present medications.  Repeat colonoscopy recommended for surveillance.  Date to be determined after pathology results reviewed.  Diltiazem gel 2 times daily into rectum for fissure.  See Dr. Carlean Purl in August or Sept.  YOU HAD AN ENDOSCOPIC PROCEDURE TODAY AT [place]:   Refer to the procedure report that was given to you for any specific questions about what was found during the examination.  If the procedure report does not answer your questions, please call your gastroenterologist to clarify.  If you requested that your care partner not be given the details of your procedure findings, then the procedure report has been included in a sealed envelope for you to review at your convenience later.  YOU SHOULD EXPECT: Some feelings of bloating in the abdomen. Passage of more gas than usual.  Walking can help get rid of the air that was put into your GI tract during the procedure and reduce the bloating. If you had a lower endoscopy (such as a colonoscopy or flexible sigmoidoscopy) you may notice spotting of blood in your stool or on the toilet paper. If you underwent a bowel prep for your procedure, you  may not have a normal bowel movement for a few days.  Please Note:  You might notice some irritation and congestion in your nose or some drainage.  This is from the oxygen used during your procedure.  There is no need for concern and it should clear up in a day or so.  SYMPTOMS TO REPORT IMMEDIATELY:   Following lower endoscopy (colonoscopy or flexible sigmoidoscopy):  Excessive amounts of blood in the stool  Significant tenderness or worsening of abdominal pains  Swelling of the abdomen that is new, acute  Fever of 100F or higher   Following upper endoscopy (EGD)  Vomiting of blood or coffee ground material  New chest pain or pain under the shoulder blades  Painful or persistently difficult swallowing  New shortness of breath  Fever of 100F or higher  Black, tarry-looking stools  For urgent or emergent issues, a gastroenterologist can be reached at any hour by calling [phone].   DIET:  We do recommend a small meal at first, but then you may proceed to your regular diet.  Drink plenty of fluids but you should avoid alcoholic beverages for 24 hours.  ACTIVITY:  You should plan to take it easy for the rest of today and you should NOT DRIVE or use heavy machinery until tomorrow (because of the sedation medicines used during the test).    FOLLOW UP: Our staff will call the number listed on your records the next business day following your  procedure to check on you and address any questions or concerns that you may have regarding the information given to you following your procedure. If we do not reach you, we will leave a message.  However, if you are feeling well and you are not experiencing any problems, there is no need to return our call.  We will assume that you have returned to your regular daily activities without incident.  If any biopsies were taken you will be contacted by phone or by letter within the next 1-3 weeks.  Please call us at [phone] if you have not  heard about the biopsies in 3 weeks.    SIGNATURES/CONFIDENTIALITY: You and/or your care partner have signed paperwork which will be entered into your electronic medical record.  These signatures attest to the fact that that the information above on your After Visit Summary has been reviewed and is understood.  Full responsibility of the confidentiality of this discharge information lies with you and/or your care-partner.

## 2017-08-02 NOTE — Progress Notes (Signed)
Report given to PACU, vss 

## 2017-08-02 NOTE — Op Note (Addendum)
[place] Patient Name: Doris French Procedure Date: 08/02/2017 8:44 AM MRN: [id number] Endoscopist: Gatha Mayer , MD Age: 67 Referring MD:  Date of Birth: [birth year] Gender: Female Account #: [id number] Procedure:                Colonoscopy Indications:              Surveillance: Personal history of adenomatous                            polyps on last colonoscopy > 5 years ago Medicines:                Propofol per Anesthesia, Monitored Anesthesia Care Procedure:                Pre-Anesthesia Assessment:                           - Prior to the procedure, a History and Physical                            was performed, and patient medications and                            allergies were reviewed. The patient's tolerance of                            previous anesthesia was also reviewed. The risks                            and benefits of the procedure and the sedation                            options and risks were discussed with the patient.                            All questions were answered, and informed consent                            was obtained. Prior Anticoagulants: The patient has                            taken no previous anticoagulant or antiplatelet                            agents. ASA Grade Assessment: III - A patient with                            severe systemic disease. After reviewing the risks                            and benefits, the patient was deemed in                            satisfactory condition to undergo the procedure.  After obtaining informed consent, the colonoscope                            was passed under direct vision. Throughout the                            procedure, the patient's blood pressure, pulse, and                            oxygen saturations were monitored continuously. The                            Colonoscope was introduced through the anus and                             advanced to the the cecum, identified by                            appendiceal orifice and ileocecal valve. The                            patient tolerated the procedure well. The                            colonoscopy was somewhat difficult due to                            significant looping. Successful completion of the                            procedure was aided by applying abdominal pressure. Scope In: 8:55:21 AM Scope Out: 9:10:50 AM Scope Withdrawal Time: 0 hours 12 minutes 19 seconds  Total Procedure Duration: 0 hours 15 minutes 29 seconds  Findings:                 The perianal examination was normal.                           The digital rectal exam findings include moderate                            rectocele.                           A diminutive polyp was found in the ascending                            colon. The polyp was sessile. The polyp was removed                            with a cold biopsy forceps. Resection and retrieval                            were complete. Verification of patient  identification for the specimen was done. Estimated                            blood loss was minimal.                           A anal fissure was found in the anal canal.                           The exam was otherwise without abnormality on                            direct and retroflexion views. Complications:            No immediate complications. Estimated Blood Loss:     Estimated blood loss was minimal. Impression:               - Moderate rectocele found on digital rectal exam.                           - One diminutive polyp in the ascending colon,                            removed with a cold biopsy forceps. Resected and                            retrieved.                           - Anal fissure.                           - The examination was otherwise normal on direct                            and retroflexion  views. Recommendation:           - Patient has a contact number available for                            emergencies. The signs and symptoms of potential                            delayed complications were discussed with the                            patient. Return to normal activities tomorrow.                            Written discharge instructions were provided to the                            patient.                           - Resume previous diet.                           -  Continue present medications.                           - Repeat colonoscopy is recommended for                            surveillance. The colonoscopy date will be                            determined after pathology results from today's                            exam become available for review.                           - 1) Diltiazem gel bid into rectum for fissure                           2) Patient to call and make appointment to see me                            (August or September) - call now                           3) return to GYN for rectocele and perinaeal                            bleeding concerns that have not stopped Gatha Mayer, MD 08/02/2017 9:22:49 AM This report has been signed electronically.

## 2017-08-02 NOTE — Progress Notes (Signed)
Called to room to assist during endoscopic procedure.  Patient ID and intended procedure confirmed with present staff. Received instructions for my participation in the procedure from the performing physician.  

## 2017-08-02 NOTE — Progress Notes (Signed)
Pt's states no medical or surgical changes since previsit or office visit. 

## 2017-08-03 ENCOUNTER — Telehealth: Payer: Self-pay

## 2017-08-03 NOTE — Telephone Encounter (Signed)
Referrals placed patient will be contacted by [facility].

## 2017-08-03 NOTE — Telephone Encounter (Signed)
  Follow up Call-  Call back number 08/02/2017  Post procedure Call Back phone  # [phone]  Permission to leave phone message Yes  Some recent data might be hidden     Patient questions:  Do you have a fever, pain , or abdominal swelling? No. Pain Score  0 *  Have you tolerated food without any problems? Yes.    Have you been able to return to your normal activities? Yes.    Do you have any questions about your discharge instructions: Diet   No. Medications  No. Follow up visit  No.  Do you have questions or concerns about your Care? No.  Actions: * If pain score is 4 or above: No action needed, pain <4.

## 2017-08-08 ENCOUNTER — Telehealth: Payer: Self-pay | Admitting: Obstetrics & Gynecology

## 2017-08-08 NOTE — Telephone Encounter (Signed)
Call placed to schedule NGYN DR REFERRAL appointment.

## 2017-08-08 NOTE — Telephone Encounter (Signed)
Patient returning call to Rosa. °

## 2017-08-11 ENCOUNTER — Encounter: Payer: Self-pay | Admitting: Internal Medicine

## 2017-08-11 DIAGNOSIS — Z86010 Personal history of colonic polyps: Secondary | ICD-10-CM

## 2017-08-11 NOTE — Progress Notes (Signed)
Diminutive adenoma Recall 2024-6

## 2017-08-13 ENCOUNTER — Telehealth: Payer: Self-pay | Admitting: Internal Medicine

## 2017-08-13 NOTE — Telephone Encounter (Signed)
Per [facility] they are basically all $39.99 and to dispense less is still the same number on man hours so not much less for 15gm size, it runs $29.99 .

## 2017-08-13 NOTE — Telephone Encounter (Signed)
Can you see what nifedipine or NTG are (topical rectal Tx) at [place] ?  I suspect they will all be about the same  Perhaps dispense less of it ?

## 2017-08-13 NOTE — Telephone Encounter (Signed)
Please advise Sir? 

## 2017-08-13 NOTE — Telephone Encounter (Signed)
I called and spoke with Tomi Bamberger and she has agreed to get one tube of the diltiazem and try it since everything is a similar price. She said she just couldn't afford it long term.

## 2017-08-14 ENCOUNTER — Encounter: Payer: Self-pay | Admitting: Obstetrics & Gynecology

## 2017-08-14 ENCOUNTER — Ambulatory Visit (INDEPENDENT_AMBULATORY_CARE_PROVIDER_SITE_OTHER): Payer: Medicare HMO | Admitting: Obstetrics & Gynecology

## 2017-08-14 ENCOUNTER — Other Ambulatory Visit (HOSPITAL_COMMUNITY)
Admission: RE | Admit: 2017-08-14 | Discharge: 2017-08-14 | Disposition: A | Discharge status: Home / Self Care | Payer: Medicare HMO | Source: Ambulatory Visit | Attending: Obstetrics & Gynecology | Admitting: Obstetrics & Gynecology

## 2017-08-14 ENCOUNTER — Other Ambulatory Visit: Payer: Self-pay

## 2017-08-14 VITALS — BP 118/70 | HR 80 | Resp 14 | Ht 64.5 in | Wt 262.0 lb

## 2017-08-14 DIAGNOSIS — K602 Anal fissure, unspecified: Secondary | ICD-10-CM | POA: Diagnosis not present

## 2017-08-14 DIAGNOSIS — Z124 Encounter for screening for malignant neoplasm of cervix: Secondary | ICD-10-CM | POA: Insufficient documentation

## 2017-08-14 DIAGNOSIS — N9089 Other specified noninflammatory disorders of vulva and perineum: Secondary | ICD-10-CM

## 2017-08-14 DIAGNOSIS — N816 Rectocele: Secondary | ICD-10-CM | POA: Diagnosis not present

## 2017-08-14 DIAGNOSIS — N762 Acute vulvitis: Secondary | ICD-10-CM | POA: Diagnosis not present

## 2017-08-14 NOTE — Patient Instructions (Signed)
The new shingles vaccine is called Shingrix.  This is a two shot series.

## 2017-08-14 NOTE — Progress Notes (Signed)
67 y.o. [id number] DivorcedCaucasianF here for new patient exam.  She was seen recently by Dr. Carlean Purl for a colonoscopy and had a small adenomatous polyp as well as an anal fissure.  Her PCP, Dr. Harrington Challenger, has suspected that she has a rectocele as well.  She does have some intermittent issues with constipation.  She's been diagnosed with IBS as well.  Symptoms for her flutuate between diarrhea and constipation.  She does not feel that she is really more on one side or the other of the loose or constipated side of bowel movements.  Denies vaginal bleeding.  Never on HRT.  She does have intermittent bleeding that seems to be from a skin issue.  Reports a few years ago, she had a boil that developed and ultimately ruptures.  Seems to have recurrent issues from this same place or near the same placed.  Is uncomfortable at time.  Patient's last menstrual period was 02/20/2002 (approximate).          Sexually active: No.  The current method of family planning is post menopausal status.    Exercising: No.   Smoker:  no  Health Maintenance: Pap:  04/02/14 Neg  History of abnormal Pap:  no MMG:  03/19/17 BIRADS1:Neg  Colonoscopy:  08/02/17 one polyp.  Adenoma.   BMD:   unsure TDaP:  Current   Pneumonia vaccine(s):  Done  Shingrix: zostavax done  Hep C testing: unsure  Screening Labs: PCP   reports that she has never smoked. She has never used smokeless tobacco. She reports that she does not drink alcohol or use drugs.  Past Medical History:  Diagnosis Date  . Allergy    seasonal  . Anxiety   . 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 Cataract    removed bilateral  . Chronic kidney disease    low kidney function  . 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 GERD (gastroesophageal reflux disease)    occasional  . Gout   . Hyperlipidemia   . Hypertension    off medicines  . Neuromuscular disorder (HCC)    trigeminal neuralgia  . Personal history of adenomatous colonic polyps  11/21/2011   10/2011 - 2 adenomas max 7 mm    . Urinary incontinence     Past Surgical History:  Procedure Laterality Date  . CATARACT EXTRACTION, BILATERAL     06-26-2017, 07-03-2017  . cesarian section    . COLONOSCOPY    . fibrous miningioma right ear     right ear  . perineum polyp     scar tissue and inflammation  . WISDOM TOOTH EXTRACTION      Current Outpatient Medications  Medication Sig Dispense Refill  . allopurinol (ZYLOPRIM) 300 MG tablet Take 300 mg by mouth daily.  0  . aspirin 81 MG tablet Take 81 mg by mouth daily.    . furosemide (LASIX) 20 MG tablet Take 20 mg by mouth 2 (two) times daily. Pt uses this prn only    . gabapentin (NEURONTIN) 300 MG capsule TAKE 1 CAPSULE BY MOUTH ONCE TO TWICE A DAY  4  . irbesartan (AVAPRO) 150 MG tablet TAKE 1/2 TO 1 TABLET BY MOUTH ONCE A DAY  4  . polyethylene glycol powder (MIRALAX) powder Take 1 Container by mouth once. 238 grams for colon 6-13    . prednisoLONE acetate (PRED FORTE) 1 % ophthalmic suspension INSTILL 1 DROP INTO LEFT EYE 4 TIMES  A DAY  0  . PROLENSA 0.07 % SOLN INSTILL 1 DROP INTO LEFT EYE EVERY DAY. BEGIN 1 DAY BEFORE SURGERY  0  . propranolol (INDERAL) 40 MG tablet TAKE 1 TABLET BY MOUTH ON AN EMPTY STOMACH TWICE A DAY X 30 DAYS  0  . simvastatin (ZOCOR) 20 MG tablet Take 20 mg by mouth at bedtime.    . Vitamin D, Ergocalciferol, (DRISDOL) 50000 UNITS CAPS Take 50,000 Units by mouth every 7 (seven) days.    Marland Kitchen diltiazem 2 % GEL Apply 1 application topically 2 (two) times daily. Into anal canal with finger to first knuckle (Patient not taking: Reported on 08/14/2017) 30 g 3   Current Facility-Administered Medications  Medication Dose Route Frequency Provider Last Rate Last Dose  . 0.9 %  sodium chloride infusion  500 mL Intravenous Once Gatha Mayer, MD        Family History  Problem Relation Age of Onset  . Diabetes Mother   . Heart disease Mother   . Hypertension Mother   . Prostate cancer Father         also non hodgkins lymphoma  . Non-Hodgkin's lymphoma Father   . Colon cancer Paternal Uncle   . Testicular cancer Brother   . Prostate cancer Brother        hx a fib  . Breast cancer Sister   . Heart disease Sister   . Colon polyps Sister   . Breast cancer Other        niece  . Rectal cancer Neg Hx   . Stomach cancer Neg Hx     Review of Systems  All other systems reviewed and are negative.   Exam:   BP 118/70 (BP Location: Right Arm, Patient Position: Sitting, Cuff Size: Large)   Pulse 80   Resp 14   Ht 5' 4.5" (1.638 m)   Wt 262 lb (118.8 kg)   LMP 02/20/2002 (Approximate)   BMI 44.28 kg/m    Height: 5' 4.5" (163.8 cm)  Ht Readings from Last 3 Encounters:  08/14/17 5' 4.5" (1.638 m)  08/02/17 5' 4.5" (1.638 m)  07/18/17 5' 4.5" (1.638 m)    General appearance: alert, cooperative and appears stated age Head: Normocephalic, without obvious abnormality, atraumatic Neck: no adenopathy, supple, symmetrical, trachea midline and thyroid normal to inspection and palpation Lungs: clear to auscultation bilaterally Breasts: normal appearance, no masses or tenderness Heart: regular rate and rhythm Abdomen: soft, non-tender; bowel sounds normal; no masses,  no organomegaly Extremities: extremities normal, atraumatic, no cyanosis or edema Skin: Skin color, texture, turgor normal. No rashes or lesions Lymph nodes: Cervical, supraclavicular, and axillary nodes normal. No abnormal inguinal nodes palpated Neurologic: Grossly normal   Pelvic: External genitalia:  Firm polyp appearing lesion just at introtus and then ulcerated appearing lesion with some minimal drainage on inferior labia majora.  (Biopsies of these lesions recommended)              Urethra:  normal appearing urethra with no masses, tenderness or lesions              Bartholins and Skenes: normal                 Vagina: normal appearing vagina with normal color and discharge, no lesions, second degree rectocele noted  mid vaginal              Cervix: no lesions  Pap taken: Yes.   Bimanual Exam:  Uterus:  normal size, contour, position, consistency, mobility, non-tender              Adnexa: normal adnexa and no mass, fullness, tenderness               Rectovaginal: Confirms               Anus:  normal sphincter tone, no lesions  Procedure:  Area cleansed with Betadine.  Sterile technique used throughout procedure.  Skin anesthestized with 1% Lidocaine.  2cc total used.  Lot:  [id number].  Exp:  1/23.  4 punch biopsy used to obtain specimen on left labia majora and pick-ups with scissors used to obtain lesion at introitus.  Biopsy also grasped with pick-ups and excised with scissors.  Adequate hemostasis obtained with silver nitrate sticks.  Dressing was not applied.  Pt tolerated procedure well.  Chaperone was present for exam.  A:  Rectocele Perineal polypoid lesion Left inferior labia majora ulcerated appearing lesion with some mild purulent drainage H/o recurrent vulvar bleeding (this it pt's biggest concern) IBS H/o anal fissure  P:   Mammogram guidelines were reviewed pap smear obtained today Biopsies sent to pathology.  Results and recommendations will be called to pt.  If labial biopsy is inconclusive, will proceed with pelvic MRI to see if there is any tracking beneath this lesion.  I could nto identify any today with probing.  This is too far from rectum to be a peri-rectal abscess.  Additional recommendations will be made once I have pathology results.  ~40 minutes spent with patient >50% of time was in face to face discussion of above in additional to physical exam and biopsies obtained today as well.

## 2017-08-15 ENCOUNTER — Encounter: Payer: Self-pay | Admitting: Obstetrics & Gynecology

## 2017-08-15 LAB — CYTOLOGY - PAP
ADEQUACY: ABSENT
Diagnosis: NEGATIVE

## 2017-09-03 ENCOUNTER — Other Ambulatory Visit: Payer: Self-pay | Admitting: *Deleted

## 2017-09-03 DIAGNOSIS — N9089 Other specified noninflammatory disorders of vulva and perineum: Secondary | ICD-10-CM

## 2017-09-04 ENCOUNTER — Other Ambulatory Visit: Payer: Medicare HMO

## 2017-09-05 ENCOUNTER — Telehealth: Payer: Self-pay | Admitting: Obstetrics & Gynecology

## 2017-09-05 NOTE — Telephone Encounter (Signed)
Patient is scheduled for MRI pelvis with/without contrast (04136) on 09/07/17 with [facility]. We are in receipt of a letter from [employer] ([employer]), which advises the request has been approved.The authorization number is [id number] and is valid 09/04/17 through 12/03/17.  Forwarding to Dr Sabra Heck for final review. Will close encounter  cc: Glorianne Manchester, RN

## 2017-09-07 ENCOUNTER — Ambulatory Visit
Admission: RE | Admit: 2017-09-07 | Discharge: 2017-09-07 | Disposition: A | Discharge status: Home / Self Care | Payer: Medicare HMO | Source: Ambulatory Visit | Attending: Obstetrics & Gynecology | Admitting: Obstetrics & Gynecology

## 2017-09-07 DIAGNOSIS — N83202 Unspecified ovarian cyst, left side: Secondary | ICD-10-CM | POA: Diagnosis not present

## 2017-09-07 DIAGNOSIS — N9089 Other specified noninflammatory disorders of vulva and perineum: Secondary | ICD-10-CM

## 2017-09-07 MED ORDER — GADOBENATE DIMEGLUMINE 529 MG/ML IV SOLN
20.0000 mL | Freq: Once | INTRAVENOUS | Status: AC | PRN
  Administered 2017-09-07: 20 mL via INTRAVENOUS

## 2017-09-17 ENCOUNTER — Encounter: Payer: Self-pay | Admitting: Obstetrics & Gynecology

## 2017-09-17 ENCOUNTER — Ambulatory Visit (INDEPENDENT_AMBULATORY_CARE_PROVIDER_SITE_OTHER): Payer: Medicare HMO | Admitting: Obstetrics & Gynecology

## 2017-09-17 VITALS — BP 126/80 | HR 68 | Resp 16 | Ht 64.5 in | Wt 258.6 lb

## 2017-09-17 DIAGNOSIS — N83202 Unspecified ovarian cyst, left side: Secondary | ICD-10-CM | POA: Diagnosis not present

## 2017-09-17 DIAGNOSIS — N764 Abscess of vulva: Secondary | ICD-10-CM | POA: Diagnosis not present

## 2017-09-17 NOTE — Progress Notes (Signed)
GYNECOLOGY  VISIT  CC:   Discuss results and repeat exam  HPI: 67 y.o. [id number] Divorced Caucasian female here for recurrent vulvar lesion that becomes tender, then has purulent drainage followed by bleeding, then seems to heal over.  This has been recurrent for about 3 years.  Two biopsies were performed of this lesion on 08/14/17 showing inflammatory tissue and granulation tissue.  I really felt this must be tracking from somewhere else and a pelvic MRI was ordered.  This was reviewed with pt today.  It was essentially negative and did not even show the vulvar lesion.  There was a 2.4cm simple left ovarian cyst that was noted.  Follow up ultrasound in 6-12 months recommended.  All of this was reviewed with the pt.  She has a sheet of notebook paper with multiple questions on it and I reviewed all of these.  She also has some questions about her rectocele and whether this should be repaired.  Her only symptom is that she does not always feel like she completely evacuates the rectum with bowel movements.  She used to have to put pressure on the right buttocks to assist with bowel movements but does not need to do this any longer.  GYNECOLOGIC HISTORY: Patient's last menstrual period was 02/20/2002 (approximate). Contraception: PMP Menopausal hormone therapy: none  Patient Active Problem List   Diagnosis Date Noted  . Anal fissure 08/02/2017  . Rectocele 08/02/2017  . Personal history of adenomatous colonic polyps 11/21/2011    Past Medical History:  Diagnosis Date  . Allergy    seasonal  . Anxiety   . 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 Cataract    removed bilateral  . Chronic kidney disease    low kidney function  . 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 GERD (gastroesophageal reflux disease)    occasional  . Gout   . Hyperlipidemia   . Hypertension    off medicines  . Neuromuscular disorder (HCC)    trigeminal neuralgia  . Personal history of adenomatous  colonic polyps 11/21/2011   10/2011 - 2 adenomas max 7 mm    . Urinary incontinence     Past Surgical History:  Procedure Laterality Date  . BRAIN MENINGIOMA EXCISION Right 2010   initially thought to be an acoustic neuroma  . CATARACT EXTRACTION, BILATERAL     06-26-2017, 07-03-2017  . [place]  . WISDOM TOOTH EXTRACTION      MEDS:   Current Outpatient Medications on File Prior to Visit  Medication Sig Dispense Refill  . allopurinol (ZYLOPRIM) 300 MG tablet Take 300 mg by mouth daily.  0  . aspirin 81 MG tablet Take 81 mg by mouth daily.    Marland Kitchen diltiazem 2 % GEL Apply 1 application topically 2 (two) times daily. Into anal canal with finger to first knuckle 30 g 3  . furosemide (LASIX) 20 MG tablet Take 20 mg by mouth 2 (two) times daily. Pt uses this prn only    . gabapentin (NEURONTIN) 300 MG capsule TAKE 1 CAPSULE BY MOUTH ONCE TO TWICE A DAY  4  . irbesartan (AVAPRO) 150 MG tablet TAKE 1/2 TO 1 TABLET BY MOUTH ONCE A DAY  4  . polyethylene glycol powder (MIRALAX) powder Take 1 Container by mouth once. 238 grams for colon 6-13    . propranolol (INDERAL) 40 MG tablet TAKE 1 TABLET BY MOUTH ON AN EMPTY  STOMACH TWICE A DAY X 30 DAYS  0  . simvastatin (ZOCOR) 20 MG tablet Take 20 mg by mouth at bedtime.    . Vitamin D, Ergocalciferol, (DRISDOL) 50000 UNITS CAPS Take 50,000 Units by mouth every 7 (seven) days.     Current Facility-Administered Medications on File Prior to Visit  Medication Dose Route Frequency Provider Last Rate Last Dose  . 0.9 %  sodium chloride infusion  500 mL Intravenous Once Gatha Mayer, MD        ALLERGIES: Other and Shellfish allergy  Family History  Problem Relation Age of Onset  . Diabetes Mother   . Heart disease Mother   . Hypertension Mother   . Prostate cancer Father        also non hodgkins lymphoma  . Non-Hodgkin's lymphoma Father   . Colon cancer Paternal Uncle   . Testicular cancer Brother   . Prostate cancer Brother        hx  a fib  . Breast cancer Sister   . Heart disease Sister   . Colon polyps Sister   . Breast cancer Other        niece  . Rectal cancer Neg Hx   . Stomach cancer Neg Hx     SH:  Divorced, non smoker  Review of Systems  Genitourinary:       Vulvar tenderness  All other systems reviewed and are negative.   PHYSICAL EXAMINATION:    BP 126/80 (BP Location: Right Arm, Patient Position: Sitting, Cuff Size: Large)   Pulse 68   Resp 16   Ht 5' 4.5" (1.638 m)   Wt 258 lb 9.6 oz (117.3 kg)   LMP 02/20/2002 (Approximate)   BMI 43.70 kg/m     General appearance: alert, cooperative and appears stated age Lymph:  No inguinal LAD  Pelvic: External genitalia:  Same lesion on left inferior labia majora that appears to be filled with purulent and with touching, lesion drained purulent material.  Lesion is about 2 x 1.5cm in size today.  It was then proved with the stick end of a q tip and a tract going inferiorly towards the rectum was noted.  This was ~6cm in depth.              Urethra:  normal appearing urethra with no masses, tenderness or lesions              Bartholins and Skenes: normal                 Vagina: normal appearing vagina with normal color and discharge, no lesions, second degree cystocele and rectocele noted              Cervix: no lesions              Bimanual Exam:  Uterus:  normal size, contour, position, consistency, mobility, non-tender              Adnexa: no mass, fullness, tenderness              Rectovaginal: Yes.   This was performed with the q tip probing the tract.  The end of the qtip felt about 1-2 cm from the rectum.                Anus:  normal sphincter tone, no lesions  Chaperone was present for exam.  Assessment: Recurrent vulvar abscess with 6cm tract towards the rectum 2.4cm left ovarian cyst on MRI  Plan:  Findings not diagnostic of a fistulous tract but concerning for this.  Feel general surgeon consultation is warranted.  Will refer to Dr. Marcello Moores  for this. Wound culture pending. Repeat PUS in 6 months.   ~30 minutes spent with patient >50% of time was in face to face discussion of above.

## 2017-09-18 ENCOUNTER — Other Ambulatory Visit: Payer: Self-pay | Admitting: Obstetrics & Gynecology

## 2017-09-18 DIAGNOSIS — L988 Other specified disorders of the skin and subcutaneous tissue: Secondary | ICD-10-CM

## 2017-09-18 NOTE — Progress Notes (Signed)
Referral to Dr. Marcello Moores placed.

## 2017-09-20 LAB — WOUND CULTURE

## 2017-09-22 MED ORDER — SULFAMETHOXAZOLE-TRIMETHOPRIM 800-160 MG PO TABS: 1.0000 | ORAL_TABLET | Freq: Two times a day (BID) | ORAL | 0 refills | Status: DC

## 2017-09-22 NOTE — Addendum Note (Signed)
Addended by: Megan Salon on: 09/22/2017 11:11 AM   Modules accepted: Orders

## 2017-09-24 ENCOUNTER — Other Ambulatory Visit: Payer: Self-pay

## 2017-09-24 ENCOUNTER — Encounter (HOSPITAL_BASED_OUTPATIENT_CLINIC_OR_DEPARTMENT_OTHER): Payer: Self-pay | Admitting: *Deleted

## 2017-09-24 ENCOUNTER — Ambulatory Visit: Payer: Self-pay | Admitting: General Surgery

## 2017-09-24 DIAGNOSIS — K603 Anal fistula: Secondary | ICD-10-CM | POA: Diagnosis not present

## 2017-09-24 NOTE — H&P (Signed)
History of Present Illness Leighton Ruff MD; 08/21/5364 10:08 AM) The patient is a 67 year old female who presents with anal fistula. 67 year old female who presents to the office for evaluation of a perianal lesion. She states approximate 3 years ago she noticed that she had a hard bowel movement and felt a tearing sensation. Several days later she noticed a bulge at the base of her labia which opened up and drained purulence for approximately 3 days. Since then she has had a chronically draining wound in that spot. It is been biopsied on 2 different occasions by Dr. Sabra Heck, both of which returned benign. Dr. Sabra Heck was able to place a probe that traversed towards the anus on exam. She was concern for perianal fistula and sent her to me for evaluation. Patient has a history of constipation.   Past Surgical History Mammie Lorenzo, LPN; 05/24/345 4:25 AM) Cataract Surgery Bilateral. Cesarean Section - 1 Colon Polyp Removal - Colonoscopy Oral Surgery  Diagnostic Studies History Mammie Lorenzo, LPN; 10/25/6385 5:64 AM) Colonoscopy within last year Mammogram within last year Pap Smear 1-5 years ago  Allergies Mammie Lorenzo, LPN; 04/22/2949 8:84 AM) No Known Allergies [09/24/2017]: Allergies Reconciled  Medication History Mammie Lorenzo, LPN; 02/25/6061 0:16 AM) Sulfamethoxazole-Trimethoprim (800-160MG  Tablet, Oral) Active. Simvastatin (20MG  Tablet, Oral) Active. Propranolol HCl (40MG  Tablet, Oral) Active. Irbesartan (150MG  Tablet, Oral) Active. Gabapentin (300MG  Capsule, Oral) Active. Aspirin (81MG  Tablet, Oral) Active. Lasix (20MG  Tablet, Oral as needed) Active. MiraLax (Oral as needed) Active. Vitamin D (50000UNIT Capsule, Oral weekly) Active. Neurontin (300MG  Capsule, Oral) Active. ClonazePAM (0.5MG  Tablet, Oral as needed) Active. Medications Reconciled  Social History Mammie Lorenzo, LPN; 0/02/930 3:55 AM) Caffeine use Carbonated beverages, Coffee,  Tea. No alcohol use No drug use Tobacco use Never smoker.  Family History Mammie Lorenzo, LPN; 08/22/2200 5:42 AM) Breast Cancer Family Members In General, Sister. Cancer Father. Colon Cancer Family Members In General. Colon Polyps Family Members In General. Depression Son. Diabetes Mellitus Family Members In General, Mother. Heart Disease Brother, Family Members In General, Mother. Heart disease in female family member before age 92 Hypertension Mother. Migraine Headache Son. Prostate Cancer Brother, Father.  Pregnancy / Birth History Mammie Lorenzo, LPN; 7/0/6237 6:28 AM) Age at menarche 64 years. Age of menopause 39-50 Gravida 2 Maternal age 67-25 Para 9  Other Problems Mammie Lorenzo, LPN; 04/21/5174 1:60 AM) Anxiety Disorder Arthritis Chronic Renal Failure Syndrome Gastroesophageal Reflux Disease Hemorrhoids High blood pressure Hypercholesterolemia     Review of Systems Claiborne Billings Dockery LPN; 08/22/7104 2:69 AM) General Present- Weight Gain. Not Present- Appetite Loss, Chills, Fatigue, Fever, Night Sweats and Weight Loss. Skin Present- Dryness. Not Present- Change in Wart/Mole, Hives, Jaundice, New Lesions, Non-Healing Wounds, Rash and Ulcer. HEENT Present- Hearing Loss and Ringing in the Ears. Not Present- Earache, Hoarseness, Nose Bleed, Oral Ulcers, Seasonal Allergies, Sinus Pain, Sore Throat, Visual Disturbances, Wears glasses/contact lenses and Yellow Eyes. Respiratory Present- Snoring. Not Present- Bloody sputum, Chronic Cough, Difficulty Breathing and Wheezing. Breast Not Present- Breast Mass, Breast Pain, Nipple Discharge and Skin Changes. Cardiovascular Present- Swelling of Extremities. Not Present- Chest Pain, Difficulty Breathing Lying Down, Leg Cramps, Palpitations, Rapid Heart Rate and Shortness of Breath. Gastrointestinal Present- Bloating. Not Present- Abdominal Pain, Bloody Stool, Change in Bowel Habits, Chronic diarrhea, Constipation,  Difficulty Swallowing, Excessive gas, Gets full quickly at meals, Hemorrhoids, Indigestion, Nausea, Rectal Pain and Vomiting. Female Genitourinary Present- Frequency and Urgency. Not Present- Nocturia, Painful Urination and Pelvic Pain. Musculoskeletal Present- Joint Stiffness and Muscle Weakness. Not  Present- Back Pain, Joint Pain, Muscle Pain and Swelling of Extremities. Neurological Present- Tremor. Not Present- Decreased Memory, Fainting, Headaches, Numbness, Seizures, Tingling, Trouble walking and Weakness. Psychiatric Present- Anxiety. Not Present- Bipolar, Change in Sleep Pattern, Depression, Fearful and Frequent crying.  Vitals Claiborne Billings Dockery LPN; 0/09/6759 9:50 AM) 09/24/2017 9:48 AM Weight: 258.8 lb Height: 64.5in Body Surface Area: 2.19 m Body Mass Index: 43.74 kg/m  Temp.: 97.74F(Temporal)  Pulse: 85 (Regular)  BP: 122/78 (Sitting, Left Arm, Standard)      Physical Exam Leighton Ruff MD; 10/23/2669 10:07 AM)  General Mental Status-Alert. General Appearance-Not in acute distress. Build & Nutrition-Well nourished. Posture-Normal posture. Gait-Normal.  Head and Neck Head-normocephalic, atraumatic with no lesions or palpable masses. Trachea-midline.  Chest and Lung Exam Chest and lung exam reveals -on auscultation, normal breath sounds, no adventitious sounds and normal vocal resonance.  Cardiovascular Cardiovascular examination reveals -normal heart sounds, regular rate and rhythm with no murmurs and no digital clubbing, cyanosis, edema, increased warmth or tenderness.  Abdomen Inspection Inspection of the abdomen reveals - No Hernias. Palpation/Percussion Palpation and Percussion of the abdomen reveal - Soft, Non Tender, No Rigidity (guarding), No hepatosplenomegaly and No Palpable abdominal masses.  Rectal Anorectal Exam External - Note: Punctate lesion noted in base of the patient's left labia. Palpable cord traversing towards  anterior midline of the anal canal. Small anal fissure noted anteriorly.  Neurologic Neurologic evaluation reveals -alert and oriented x 3 with no impairment of recent or remote memory, normal attention span and ability to concentrate, normal sensation and normal coordination.  Musculoskeletal Normal Exam - Bilateral-Upper Extremity Strength Normal and Lower Extremity Strength Normal.    Assessment & Plan Leighton Ruff MD; 03/27/5807 10:09 AM)  ANAL FISTULA (K60.3) Impression: 67 year old female who presents to the office for evaluation of an anal fistula. On exam this does appear to be was causing her recurrent labial wound. We discussed exam under anesthesia in the operating room. We discussed treatment options including fistulotomy and seton placement for subsequent ligation of internal fistula several months later. We discussed the risk and benefits of both these procedures. We discussed that there is a slight risk of incontinence with fistulotomy and therefore if this risk is exceeded known standards, we would proceed with seton placement. I believe she understands this and wishes to proceed.

## 2017-09-24 NOTE — H&P (View-Only) (Signed)
History of Present Illness Leighton Ruff MD; 04/22/6710 10:08 AM) The patient is a 67 year old female who presents with anal fistula. 67 year old female who presents to the office for evaluation of a perianal lesion. She states approximate 3 years ago she noticed that she had a hard bowel movement and felt a tearing sensation. Several days later she noticed a bulge at the base of her labia which opened up and drained purulence for approximately 3 days. Since then she has had a chronically draining wound in that spot. It is been biopsied on 2 different occasions by Dr. Sabra Heck, both of which returned benign. Dr. Sabra Heck was able to place a probe that traversed towards the anus on exam. She was concern for perianal fistula and sent her to me for evaluation. Patient has a history of constipation.   Past Surgical History Mammie Lorenzo, LPN; 05/25/8097 8:33 AM) Cataract Surgery Bilateral. Cesarean Section - 1 Colon Polyp Removal - Colonoscopy Oral Surgery  Diagnostic Studies History Mammie Lorenzo, LPN; 09/22/5051 9:76 AM) Colonoscopy within last year Mammogram within last year Pap Smear 1-5 years ago  Allergies Mammie Lorenzo, LPN; 08/23/4191 7:90 AM) No Known Allergies [09/24/2017]: Allergies Reconciled  Medication History Mammie Lorenzo, LPN; 03/27/971 5:32 AM) Sulfamethoxazole-Trimethoprim (800-160MG  Tablet, Oral) Active. Simvastatin (20MG  Tablet, Oral) Active. Propranolol HCl (40MG  Tablet, Oral) Active. Irbesartan (150MG  Tablet, Oral) Active. Gabapentin (300MG  Capsule, Oral) Active. Aspirin (81MG  Tablet, Oral) Active. Lasix (20MG  Tablet, Oral as needed) Active. MiraLax (Oral as needed) Active. Vitamin D (50000UNIT Capsule, Oral weekly) Active. Neurontin (300MG  Capsule, Oral) Active. ClonazePAM (0.5MG  Tablet, Oral as needed) Active. Medications Reconciled  Social History Mammie Lorenzo, LPN; 10/29/2424 8:34 AM) Caffeine use Carbonated beverages, Coffee,  Tea. No alcohol use No drug use Tobacco use Never smoker.  Family History Mammie Lorenzo, LPN; 02/29/6220 9:79 AM) Breast Cancer Family Members In General, Sister. Cancer Father. Colon Cancer Family Members In General. Colon Polyps Family Members In General. Depression Son. Diabetes Mellitus Family Members In General, Mother. Heart Disease Brother, Family Members In General, Mother. Heart disease in female family member before age 68 Hypertension Mother. Migraine Headache Son. Prostate Cancer Brother, Father.  Pregnancy / Birth History Mammie Lorenzo, LPN; 09/28/2117 4:17 AM) Age at menarche 20 years. Age of menopause 83-50 Gravida 2 Maternal age 17-25 Para 58  Other Problems Mammie Lorenzo, LPN; 4/0/8144 8:18 AM) Anxiety Disorder Arthritis Chronic Renal Failure Syndrome Gastroesophageal Reflux Disease Hemorrhoids High blood pressure Hypercholesterolemia     Review of Systems Claiborne Billings Dockery LPN; 06/26/3147 7:02 AM) General Present- Weight Gain. Not Present- Appetite Loss, Chills, Fatigue, Fever, Night Sweats and Weight Loss. Skin Present- Dryness. Not Present- Change in Wart/Mole, Hives, Jaundice, New Lesions, Non-Healing Wounds, Rash and Ulcer. HEENT Present- Hearing Loss and Ringing in the Ears. Not Present- Earache, Hoarseness, Nose Bleed, Oral Ulcers, Seasonal Allergies, Sinus Pain, Sore Throat, Visual Disturbances, Wears glasses/contact lenses and Yellow Eyes. Respiratory Present- Snoring. Not Present- Bloody sputum, Chronic Cough, Difficulty Breathing and Wheezing. Breast Not Present- Breast Mass, Breast Pain, Nipple Discharge and Skin Changes. Cardiovascular Present- Swelling of Extremities. Not Present- Chest Pain, Difficulty Breathing Lying Down, Leg Cramps, Palpitations, Rapid Heart Rate and Shortness of Breath. Gastrointestinal Present- Bloating. Not Present- Abdominal Pain, Bloody Stool, Change in Bowel Habits, Chronic diarrhea, Constipation,  Difficulty Swallowing, Excessive gas, Gets full quickly at meals, Hemorrhoids, Indigestion, Nausea, Rectal Pain and Vomiting. Female Genitourinary Present- Frequency and Urgency. Not Present- Nocturia, Painful Urination and Pelvic Pain. Musculoskeletal Present- Joint Stiffness and Muscle Weakness. Not  Present- Back Pain, Joint Pain, Muscle Pain and Swelling of Extremities. Neurological Present- Tremor. Not Present- Decreased Memory, Fainting, Headaches, Numbness, Seizures, Tingling, Trouble walking and Weakness. Psychiatric Present- Anxiety. Not Present- Bipolar, Change in Sleep Pattern, Depression, Fearful and Frequent crying.  Vitals Claiborne Billings Dockery LPN; 06/25/3746 2:70 AM) 09/24/2017 9:48 AM Weight: 258.8 lb Height: 64.5in Body Surface Area: 2.19 m Body Mass Index: 43.74 kg/m  Temp.: 97.51F(Temporal)  Pulse: 85 (Regular)  BP: 122/78 (Sitting, Left Arm, Standard)      Physical Exam Leighton Ruff MD; 08/27/6752 10:07 AM)  General Mental Status-Alert. General Appearance-Not in acute distress. Build & Nutrition-Well nourished. Posture-Normal posture. Gait-Normal.  Head and Neck Head-normocephalic, atraumatic with no lesions or palpable masses. Trachea-midline.  Chest and Lung Exam Chest and lung exam reveals -on auscultation, normal breath sounds, no adventitious sounds and normal vocal resonance.  Cardiovascular Cardiovascular examination reveals -normal heart sounds, regular rate and rhythm with no murmurs and no digital clubbing, cyanosis, edema, increased warmth or tenderness.  Abdomen Inspection Inspection of the abdomen reveals - No Hernias. Palpation/Percussion Palpation and Percussion of the abdomen reveal - Soft, Non Tender, No Rigidity (guarding), No hepatosplenomegaly and No Palpable abdominal masses.  Rectal Anorectal Exam External - Note: Punctate lesion noted in base of the patient's left labia. Palpable cord traversing towards  anterior midline of the anal canal. Small anal fissure noted anteriorly.  Neurologic Neurologic evaluation reveals -alert and oriented x 3 with no impairment of recent or remote memory, normal attention span and ability to concentrate, normal sensation and normal coordination.  Musculoskeletal Normal Exam - Bilateral-Upper Extremity Strength Normal and Lower Extremity Strength Normal.    Assessment & Plan Leighton Ruff MD; 05/29/2008 10:09 AM)  ANAL FISTULA (K60.3) Impression: 67 year old female who presents to the office for evaluation of an anal fistula. On exam this does appear to be was causing her recurrent labial wound. We discussed exam under anesthesia in the operating room. We discussed treatment options including fistulotomy and seton placement for subsequent ligation of internal fistula several months later. We discussed the risk and benefits of both these procedures. We discussed that there is a slight risk of incontinence with fistulotomy and therefore if this risk is exceeded known standards, we would proceed with seton placement. I believe she understands this and wishes to proceed.

## 2017-09-24 NOTE — Progress Notes (Signed)
Spoke w/ pt via phone for pre-op interview.  Npo after mn.  Arrive at [facility].  Needs istat and ekg.  Will take propranolol am dos w/ sips of water.

## 2017-09-27 ENCOUNTER — Ambulatory Visit (HOSPITAL_BASED_OUTPATIENT_CLINIC_OR_DEPARTMENT_OTHER): Payer: Medicare HMO | Admitting: Anesthesiology

## 2017-09-27 ENCOUNTER — Ambulatory Visit (HOSPITAL_BASED_OUTPATIENT_CLINIC_OR_DEPARTMENT_OTHER)
Admission: RE | Admit: 2017-09-27 | Discharge: 2017-09-27 | Disposition: A | Discharge status: Home / Self Care | Payer: Medicare HMO | Source: Ambulatory Visit | Attending: General Surgery | Admitting: General Surgery

## 2017-09-27 ENCOUNTER — Encounter (HOSPITAL_BASED_OUTPATIENT_CLINIC_OR_DEPARTMENT_OTHER)
Admission: RE | Disposition: A | Discharge status: Home / Self Care | Payer: Self-pay | Source: Ambulatory Visit | Attending: General Surgery

## 2017-09-27 ENCOUNTER — Encounter (HOSPITAL_BASED_OUTPATIENT_CLINIC_OR_DEPARTMENT_OTHER): Payer: Self-pay | Admitting: Anesthesiology

## 2017-09-27 DIAGNOSIS — Z800 Family history of malignant neoplasm of digestive organs: Secondary | ICD-10-CM | POA: Diagnosis not present

## 2017-09-27 DIAGNOSIS — I10 Essential (primary) hypertension: Secondary | ICD-10-CM | POA: Insufficient documentation

## 2017-09-27 DIAGNOSIS — K603 Anal fistula: Secondary | ICD-10-CM | POA: Insufficient documentation

## 2017-09-27 DIAGNOSIS — Z79899 Other long term (current) drug therapy: Secondary | ICD-10-CM | POA: Insufficient documentation

## 2017-09-27 DIAGNOSIS — K644 Residual hemorrhoidal skin tags: Secondary | ICD-10-CM | POA: Insufficient documentation

## 2017-09-27 DIAGNOSIS — Z7982 Long term (current) use of aspirin: Secondary | ICD-10-CM | POA: Diagnosis not present

## 2017-09-27 DIAGNOSIS — R69 Illness, unspecified: Secondary | ICD-10-CM | POA: Diagnosis not present

## 2017-09-27 DIAGNOSIS — N816 Rectocele: Secondary | ICD-10-CM | POA: Diagnosis not present

## 2017-09-27 DIAGNOSIS — E7800 Pure hypercholesterolemia, unspecified: Secondary | ICD-10-CM | POA: Insufficient documentation

## 2017-09-27 DIAGNOSIS — K5900 Constipation, unspecified: Secondary | ICD-10-CM | POA: Diagnosis not present

## 2017-09-27 DIAGNOSIS — Z86010 Personal history of colonic polyps: Secondary | ICD-10-CM | POA: Diagnosis not present

## 2017-09-27 DIAGNOSIS — F419 Anxiety disorder, unspecified: Secondary | ICD-10-CM | POA: Insufficient documentation

## 2017-09-27 HISTORY — DX: Chronic kidney disease, stage 3 unspecified: N18.30

## 2017-09-27 HISTORY — DX: Anal fistula: K60.3

## 2017-09-27 HISTORY — DX: Irritable bowel syndrome, unspecified: K58.9

## 2017-09-27 HISTORY — DX: Weakness: R53.1

## 2017-09-27 HISTORY — DX: Benign neoplasm of cranial nerves: D33.3

## 2017-09-27 HISTORY — DX: Chronic kidney disease, stage 3 (moderate): N18.3

## 2017-09-27 HISTORY — PX: FISTULOTOMY: SHX6413

## 2017-09-27 HISTORY — DX: Unspecified ovarian cyst, left side: N83.202

## 2017-09-27 HISTORY — DX: Trigeminal neuralgia: G50.0

## 2017-09-27 HISTORY — DX: Other seasonal allergic rhinitis: J30.2

## 2017-09-27 HISTORY — DX: Anal fistula, unspecified: K60.30

## 2017-09-27 LAB — POCT I-STAT 4, (NA,K, GLUC, HGB,HCT)
GLUCOSE: 94 mg/dL (ref 70–99)
HEMATOCRIT: 41 % (ref 36.0–46.0)
Hemoglobin: 13.9 g/dL (ref 12.0–15.0)
POTASSIUM: 4.1 mmol/L (ref 3.5–5.1)
Sodium: 145 mmol/L (ref 135–145)

## 2017-09-27 SURGERY — EXAM UNDER ANESTHESIA
Anesthesia: General | Site: Rectum

## 2017-09-27 MED ORDER — PROPOFOL 10 MG/ML IV BOLUS
INTRAVENOUS | Status: AC
  Filled 2017-09-27: qty 20

## 2017-09-27 MED ORDER — ACETAMINOPHEN 650 MG RE SUPP
650.0000 mg | RECTAL | Status: DC | PRN
  Filled 2017-09-27: qty 1

## 2017-09-27 MED ORDER — ONDANSETRON HCL 4 MG/2ML IJ SOLN
INTRAMUSCULAR | Status: DC | PRN
  Administered 2017-09-27: 4 mg via INTRAVENOUS

## 2017-09-27 MED ORDER — MIDAZOLAM HCL 5 MG/5ML IJ SOLN
INTRAMUSCULAR | Status: DC | PRN
  Administered 2017-09-27: 1 mg via INTRAVENOUS

## 2017-09-27 MED ORDER — OXYCODONE HCL 5 MG PO TABS
5.0000 mg | ORAL_TABLET | ORAL | Status: DC | PRN
  Administered 2017-09-27: 5 mg via ORAL
  Filled 2017-09-27: qty 2

## 2017-09-27 MED ORDER — PROPOFOL 10 MG/ML IV BOLUS
INTRAVENOUS | Status: DC | PRN
  Administered 2017-09-27: 150 mg via INTRAVENOUS

## 2017-09-27 MED ORDER — OXYCODONE HCL 5 MG PO TABS: 5.0000 mg | ORAL_TABLET | ORAL | 0 refills | Status: DC | PRN

## 2017-09-27 MED ORDER — ACETAMINOPHEN 500 MG PO TABS
ORAL_TABLET | ORAL | Status: AC
  Filled 2017-09-27: qty 2

## 2017-09-27 MED ORDER — ACETAMINOPHEN 500 MG PO TABS
1000.0000 mg | ORAL_TABLET | ORAL | Status: AC
  Administered 2017-09-27: 1000 mg via ORAL
  Filled 2017-09-27: qty 2

## 2017-09-27 MED ORDER — LIDOCAINE HCL (CARDIAC) PF 100 MG/5ML IV SOSY
PREFILLED_SYRINGE | INTRAVENOUS | Status: DC | PRN
  Administered 2017-09-27: 50 mg via INTRAVENOUS

## 2017-09-27 MED ORDER — SODIUM CHLORIDE 0.9 % IV SOLN
INTRAVENOUS | Status: DC
  Administered 2017-09-27: 09:00:00 via INTRAVENOUS
  Filled 2017-09-27: qty 1000

## 2017-09-27 MED ORDER — FENTANYL CITRATE (PF) 100 MCG/2ML IJ SOLN
INTRAMUSCULAR | Status: DC | PRN
  Administered 2017-09-27: 50 ug via INTRAVENOUS

## 2017-09-27 MED ORDER — LIDOCAINE 5 % EX OINT
TOPICAL_OINTMENT | CUTANEOUS | Status: DC | PRN
  Administered 2017-09-27: 1

## 2017-09-27 MED ORDER — SODIUM CHLORIDE 0.9% FLUSH
3.0000 mL | INTRAVENOUS | Status: DC | PRN
  Filled 2017-09-27: qty 3

## 2017-09-27 MED ORDER — DEXAMETHASONE SODIUM PHOSPHATE 4 MG/ML IJ SOLN
INTRAMUSCULAR | Status: DC | PRN
  Administered 2017-09-27: 10 mg via INTRAVENOUS

## 2017-09-27 MED ORDER — FENTANYL CITRATE (PF) 100 MCG/2ML IJ SOLN
25.0000 ug | INTRAMUSCULAR | Status: DC | PRN
  Filled 2017-09-27: qty 1

## 2017-09-27 MED ORDER — FENTANYL CITRATE (PF) 100 MCG/2ML IJ SOLN
INTRAMUSCULAR | Status: AC
  Filled 2017-09-27: qty 2

## 2017-09-27 MED ORDER — BUPIVACAINE-EPINEPHRINE 0.5% -1:200000 IJ SOLN
INTRAMUSCULAR | Status: DC | PRN
  Administered 2017-09-27: 30 mL

## 2017-09-27 MED ORDER — OXYCODONE HCL 5 MG PO TABS
ORAL_TABLET | ORAL | Status: AC
  Filled 2017-09-27: qty 1

## 2017-09-27 MED ORDER — HYDROGEN PEROXIDE 3 % EX SOLN
CUTANEOUS | Status: DC | PRN
  Administered 2017-09-27: 1

## 2017-09-27 MED ORDER — SUCCINYLCHOLINE CHLORIDE 20 MG/ML IJ SOLN
INTRAMUSCULAR | Status: DC | PRN
  Administered 2017-09-27: 140 mg via INTRAVENOUS

## 2017-09-27 MED ORDER — SODIUM CHLORIDE 0.9 % IV SOLN
250.0000 mL | INTRAVENOUS | Status: DC | PRN
  Filled 2017-09-27: qty 250

## 2017-09-27 MED ORDER — ACETAMINOPHEN 325 MG PO TABS
650.0000 mg | ORAL_TABLET | ORAL | Status: DC | PRN
  Filled 2017-09-27: qty 2

## 2017-09-27 MED ORDER — SODIUM CHLORIDE 0.9% FLUSH
3.0000 mL | Freq: Two times a day (BID) | INTRAVENOUS | Status: DC
  Filled 2017-09-27: qty 3

## 2017-09-27 MED ORDER — MIDAZOLAM HCL 2 MG/2ML IJ SOLN
INTRAMUSCULAR | Status: AC
  Filled 2017-09-27: qty 2

## 2017-09-27 SURGICAL SUPPLY — 59 items
APL SKNCLS STERI-STRIP NONHPOA (GAUZE/BANDAGES/DRESSINGS) ×2
BENZOIN TINCTURE PRP APPL 2/3 (GAUZE/BANDAGES/DRESSINGS) ×4 IMPLANT
BLADE EXTENDED COATED 6.5IN (ELECTRODE) IMPLANT
BLADE HEX COATED 2.75 (ELECTRODE) ×2 IMPLANT
BLADE SURG 10 STRL SS (BLADE) IMPLANT
BLADE SURG 15 STRL LF DISP TIS (BLADE) IMPLANT
BLADE SURG 15 STRL SS (BLADE)
BRIEF STRETCH FOR OB PAD LRG (UNDERPADS AND DIAPERS) ×4 IMPLANT
CANISTER SUCT 3000ML PPV (MISCELLANEOUS) ×2 IMPLANT
COVER BACK TABLE 60X90IN (DRAPES) ×2 IMPLANT
COVER MAYO STAND STRL (DRAPES) ×2 IMPLANT
DECANTER SPIKE VIAL GLASS SM (MISCELLANEOUS) ×2 IMPLANT
DRAPE LAPAROTOMY 100X72 PEDS (DRAPES) ×2 IMPLANT
DRAPE SHEET LG 3/4 BI-LAMINATE (DRAPES) IMPLANT
DRAPE UNDERBUTTOCKS STRL (DRAPE) IMPLANT
DRAPE UTILITY XL STRL (DRAPES) ×2 IMPLANT
ELECT REM PT RETURN 9FT ADLT (ELECTROSURGICAL) ×2
ELECTRODE REM PT RTRN 9FT ADLT (ELECTROSURGICAL) ×1 IMPLANT
GAUZE 4X4 16PLY RFD (DISPOSABLE) IMPLANT
GAUZE SPONGE 4X4 12PLY STRL (GAUZE/BANDAGES/DRESSINGS) ×2 IMPLANT
GAUZE SPONGE 4X4 12PLY STRL LF (GAUZE/BANDAGES/DRESSINGS) IMPLANT
GLOVE BIO SURGEON STRL SZ 6.5 (GLOVE) ×2 IMPLANT
GLOVE BIOGEL PI IND STRL 7.5 (GLOVE) IMPLANT
GLOVE BIOGEL PI INDICATOR 7.5 (GLOVE) ×2
GLOVE INDICATOR 7.0 STRL GRN (GLOVE) ×2 IMPLANT
GLOVE SURG SYN 6.5 ES PF (GLOVE) ×4 IMPLANT
GLOVE SURG SYN 6.5 PF PI (GLOVE) IMPLANT
GOWN SPEC L3 XXLG W/TWL (GOWN DISPOSABLE) ×2 IMPLANT
HYDROGEN PEROXIDE 16OZ (MISCELLANEOUS) ×2 IMPLANT
IV CATH 14GX2 1/4 (CATHETERS) ×1 IMPLANT
IV CATH 18G SAFETY (IV SOLUTION) IMPLANT
KIT TURNOVER CYSTO (KITS) ×2 IMPLANT
LEGGING LITHOTOMY PAIR STRL (DRAPES) IMPLANT
LOOP VESSEL MAXI BLUE (MISCELLANEOUS) ×1 IMPLANT
NEEDLE HYPO 22GX1.5 SAFETY (NEEDLE) ×2 IMPLANT
NS IRRIG 500ML POUR BTL (IV SOLUTION) ×2 IMPLANT
PACK BASIN DAY SURGERY FS (CUSTOM PROCEDURE TRAY) ×2 IMPLANT
PAD ABD 8X10 STRL (GAUZE/BANDAGES/DRESSINGS) ×2 IMPLANT
PAD ARMBOARD 7.5X6 YLW CONV (MISCELLANEOUS) ×2 IMPLANT
PENCIL BUTTON HOLSTER BLD 10FT (ELECTRODE) ×2 IMPLANT
SPONGE HEMORRHOID 8X3CM (HEMOSTASIS) IMPLANT
SPONGE SURGIFOAM ABS GEL 100 (HEMOSTASIS) IMPLANT
SPONGE SURGIFOAM ABS GEL 12-7 (HEMOSTASIS) IMPLANT
SUCTION FRAZIER HANDLE 10FR (MISCELLANEOUS)
SUCTION TUBE FRAZIER 10FR DISP (MISCELLANEOUS) IMPLANT
SUT CHROMIC 2 0 SH (SUTURE) IMPLANT
SUT CHROMIC 3 0 SH 27 (SUTURE) IMPLANT
SUT ETHIBOND 0 (SUTURE) ×2 IMPLANT
SUT VIC AB 2-0 SH 27 (SUTURE)
SUT VIC AB 2-0 SH 27XBRD (SUTURE) IMPLANT
SUT VIC AB 3-0 SH 18 (SUTURE) IMPLANT
SUT VIC AB 4-0 P-3 18XBRD (SUTURE) IMPLANT
SUT VIC AB 4-0 P3 18 (SUTURE)
SYR 10ML LL (SYRINGE) IMPLANT
SYR CONTROL 10ML LL (SYRINGE) ×2 IMPLANT
TOWEL OR 17X24 6PK STRL BLUE (TOWEL DISPOSABLE) ×2 IMPLANT
TRAY DSU PREP LF (CUSTOM PROCEDURE TRAY) ×2 IMPLANT
TUBE CONNECTING 12X1/4 (SUCTIONS) ×2 IMPLANT
YANKAUER SUCT BULB TIP NO VENT (SUCTIONS) ×2 IMPLANT

## 2017-09-27 NOTE — Op Note (Signed)
09/27/2017  9:48 AM  PATIENT:  Doris French  67 y.o. female  Patient Care Team: Lawerance Cruel, MD as PCP - General (Family Medicine) Izora Gala, MD as Referring Physician (Otolaryngology)  PRE-OPERATIVE DIAGNOSIS:  ANAL FISTULA  POST-OPERATIVE DIAGNOSIS:  ANAL FISTULA  PROCEDURE:   ANAL EXAM UNDER ANESTHESIA SETON PLACEMENT  Surgeon(s): Leighton Ruff, MD  ASSISTANT: none   ANESTHESIA:   local and general  SPECIMEN:  No Specimen  DISPOSITION OF SPECIMEN:  N/A  COUNTS:  YES  PLAN OF CARE: Discharge to home after PACU  PATIENT DISPOSITION:  PACU - hemodynamically stable.  INDICATION: 67 y.o. F with persistent labial wound concerning for anorectal fistula   OR FINDINGS: Deep trans-sphincteric fistula with distal anterior midline internal opening.  Fistula traverses under entire external sphincter complex.  DESCRIPTION: the patient was identified in the preoperative holding area and taken to the OR where they were laid on the operating room table.  General anesthesia was induced without difficulty. The patient was then positioned in prone jackknife position with buttocks gently taped apart.  The patient was then prepped and draped in usual sterile fashion.  SCDs were noted to be in place prior to the initiation of anesthesia. A surgical timeout was performed indicating the correct patient, procedure, positioning and need for preoperative antibiotics.  A rectal block was performed using Marcaine with epinephrine.    I began with a digital rectal exam.  There was good resting tone.  I then placed a Hill-Ferguson anoscope into the anal canal and evaluated this completely.  There was a small anterior anal fissure.  There was a corresponding skin tag in this area.  I placed an S-shaped fistula probe into the labial wound and this traverse to the anal canal.  I was unable to find the internal opening and therefore injected hydrogen peroxide into the fistula tract.  This allowed  me to delineate the opening at the area of the anal fissure.  I was able then to get the fistula probe into the fistula tract without difficulty.  An Ethibond suture was brought through the fistula tract and this was used to bring the seton vessel loop into the fistula tract.  The Ethibond was then used to fix the vessel loop into a loose circular seton.  I divided the most distal portion of the fistula tract to allow for the seton to come up above the labial wound.  Hemostasis was achieved with electrocautery.  Additional Marcaine was placed around the area of electrocautery for added analgesia.  A dressing was applied.  The patient was awakened from anesthesia and sent to the postanesthesia care unit in stable condition.  All counts were correct per operating staff.

## 2017-09-27 NOTE — Anesthesia Postprocedure Evaluation (Signed)
Anesthesia Post Note  Patient: Doris French  Procedure(s) Performed: ANAL EXAM UNDER ANESTHESIA (N/A Rectum) SETON PLACEMENT (N/A Rectum)     Patient location during evaluation: PACU Anesthesia Type: General Level of consciousness: awake Pain management: pain level controlled Vital Signs Assessment: post-procedure vital signs reviewed and stable Respiratory status: spontaneous breathing Cardiovascular status: stable Anesthetic complications: no    Last Vitals:  Vitals:   09/27/17 0951 09/27/17 1000  BP: (!) 141/89 129/63  Pulse:  66  Resp: 10 14  Temp:    SpO2:  100%    Last Pain:  Vitals:   09/27/17 1000  TempSrc:   PainSc: 0-No pain                 Solveig Fangman

## 2017-09-27 NOTE — Transfer of Care (Signed)
Immediate Anesthesia Transfer of Care Note  Patient: Doris French  Procedure(s) Performed: ANAL EXAM UNDER ANESTHESIA (N/A Rectum) SETON PLACEMENT (N/A Rectum)  Patient Location: PACU  Anesthesia Type:General  Level of Consciousness: awake  Airway & Oxygen Therapy: Patient Spontanous Breathing and Patient connected to face mask oxygen  Post-op Assessment: Report given to RN and Post -op Vital signs reviewed and stable  Post vital signs: Reviewed and stable  Last Vitals:  Vitals Value Taken Time  BP 141/89 09/27/2017  9:51 AM  Temp    Pulse 70 09/27/2017  9:53 AM  Resp 10 09/27/2017  9:53 AM  SpO2 100 % 09/27/2017  9:53 AM  Vitals shown include unvalidated device data.  Last Pain:  Vitals:   09/27/17 0746  TempSrc: Oral      Patients Stated Pain Goal: 5 (11/19/55 4734)  Complications: No apparent anesthesia complications

## 2017-09-27 NOTE — Anesthesia Procedure Notes (Signed)
Procedure Name: Intubation Date/Time: 09/27/2017 9:14 AM Performed by: Lieutenant Diego, CRNA Pre-anesthesia Checklist: Patient identified, Emergency Drugs available, Suction available and Patient being monitored Patient Re-evaluated:Patient Re-evaluated prior to induction Oxygen Delivery Method: Circle system utilized Preoxygenation: Pre-oxygenation with 100% oxygen Induction Type: IV induction Ventilation: Mask ventilation without difficulty Laryngoscope Size: Miller and 2 Grade View: Grade I Tube type: Oral Tube size: 7.0 mm Number of attempts: 1 Airway Equipment and Method: Stylet Placement Confirmation: ETT inserted through vocal cords under direct vision,  positive ETCO2 and breath sounds checked- equal and bilateral Secured at: 22 cm Tube secured with: Tape Dental Injury: Teeth and Oropharynx as per pre-operative assessment

## 2017-09-27 NOTE — Anesthesia Preprocedure Evaluation (Addendum)
Anesthesia Evaluation  Patient identified by MRN, date of birth, ID band Patient awake    Reviewed: Allergy & Precautions, NPO status , Patient's Chart, lab work & pertinent test results  Airway Mallampati: II  TM Distance: >3 FB     Dental   Pulmonary neg pulmonary ROS,    breath sounds clear to auscultation       Cardiovascular hypertension,  Rhythm:Regular Rate:Normal     Neuro/Psych    GI/Hepatic Neg liver ROS, GERD  ,  Endo/Other    Renal/GU Renal disease     Musculoskeletal   Abdominal   Peds  Hematology   Anesthesia Other Findings   Reproductive/Obstetrics                             Anesthesia Physical Anesthesia Plan  ASA: III  Anesthesia Plan: General   Post-op Pain Management:    Induction: Intravenous  PONV Risk Score and Plan: Treatment may vary due to age or medical condition  Airway Management Planned: Oral ETT  Additional Equipment:   Intra-op Plan:   Post-operative Plan: Extubation in OR  Informed Consent: I have reviewed the patients History and Physical, chart, labs and discussed the procedure including the risks, benefits and alternatives for the proposed anesthesia with the patient or authorized representative who has indicated his/her understanding and acceptance.   Dental advisory given  Plan Discussed with: Anesthesiologist, CRNA and Surgeon  Anesthesia Plan Comments:        Anesthesia Quick Evaluation

## 2017-09-27 NOTE — Interval H&P Note (Signed)
History and Physical Interval Note:  09/27/2017 7:50 AM  Doris French  has presented today for surgery, with the diagnosis of ANAL FISTULA  The various methods of treatment have been discussed with the patient and family. After consideration of risks, benefits and other options for treatment, the patient has consented to  Procedure(s): ANAL EXAM UNDER ANESTHESIA (N/A) FISTULOTOMY VS SETON (N/A) as a surgical intervention .  The patient's history has been reviewed, patient examined, no change in status, stable for surgery.  I have reviewed the patient's chart and labs.  Questions were answered to the patient's satisfaction.     Rosario Adie, MD  [facility]

## 2017-09-27 NOTE — Discharge Instructions (Addendum)
Beginning the day after surgery: ° °You may sit in a tub of warm water 2-3 times a day to relieve discomfort. ° °Eat a regular diet high in fiber.  Avoid foods that give you constipation or diarrhea.  Avoid foods that are difficult to digest, such as seeds, nuts, corn or popcorn. ° °Do not go any longer than 2 days without a bowel movement.  You may take a dose of Milk of Magnesia if you become constipated.   ° °Drink 6-8 glasses of water daily. ° °Walking is encouraged.  Avoid strenuous activity and heavy lifting for one month after surgery.   ° °Call the office if you have any questions or concerns.  Call immediately if you develop: ° °· Excessive rectal bleeding (more than a cup or passing large clots) °· Increased discomfort °· Fever greater than 100 F °· Difficulty urinating ° ° °Post Anesthesia Home Care Instructions ° °Activity: °Get plenty of rest for the remainder of the day. A responsible individual must stay with you for 24 hours following the procedure.  °For the next 24 hours, DO NOT: °-Drive a car °-Operate machinery °-Drink alcoholic beverages °-Take any medication unless instructed by your physician °-Make any legal decisions or sign important papers. ° °Meals: °Start with liquid foods such as gelatin or soup. Progress to regular foods as tolerated. Avoid greasy, spicy, heavy foods. If nausea and/or vomiting occur, drink only clear liquids until the nausea and/or vomiting subsides. Call your physician if vomiting continues. ° °Special Instructions/Symptoms: °Your throat may feel dry or sore from the anesthesia or the breathing tube placed in your throat during surgery. If this causes discomfort, gargle with warm salt water. The discomfort should disappear within 24 hours. ° ° °

## 2017-09-28 ENCOUNTER — Encounter (HOSPITAL_BASED_OUTPATIENT_CLINIC_OR_DEPARTMENT_OTHER): Payer: Self-pay | Admitting: General Surgery

## 2017-11-01 DIAGNOSIS — R69 Illness, unspecified: Secondary | ICD-10-CM | POA: Diagnosis not present

## 2017-11-27 DIAGNOSIS — Z23 Encounter for immunization: Secondary | ICD-10-CM | POA: Diagnosis not present

## 2017-11-29 ENCOUNTER — Ambulatory Visit: Payer: Self-pay | Admitting: General Surgery

## 2017-11-29 NOTE — H&P (Signed)
History of Present Illness Leighton Ruff MD; 57/32/2025 10:34 AM) The patient is a 67 year old female who presents with anal fistula. 67 year old female who presented to the office for evaluation of a perianal lesion. She states approximate 3 years ago she noticed that she had a hard bowel movement and felt a tearing sensation. Several days later she noticed a bulge at the base of her labia which opened up and drained purulence for approximately 3 days. Since then she has had a chronically draining wound in that spot. It is been biopsied on 2 different occasions by Dr. Sabra Heck, both of which returned benign. Dr. Sabra Heck was able to place a probe that traversed towards the anus on exam. She was concern for perianal fistula and sent her to me for evaluation. Patient has a history of constipation. She underwent anal EUA and a deep anterior anal fistula was noted. A seton was placed in early Aug 18.    Problem List/Past Medical Leighton Ruff, MD; 42/70/6237 10:34 AM) ANAL FISTULA (K60.3)  Past Surgical History Leighton Ruff, MD; 62/83/1517 10:34 AM) Cataract Surgery Bilateral. Cesarean Section - 1 Colon Polyp Removal - Colonoscopy Oral Surgery  Diagnostic Studies History Leighton Ruff, MD; 61/60/7371 10:34 AM) Colonoscopy within last year Mammogram within last year Pap Smear 1-5 years ago  Allergies Sabino Gasser, [facility]; 11/29/2017 10:26 AM) No Known Allergies [09/24/2017]: Allergies Reconciled  Medication History Sabino Gasser, CMA; 11/29/2017 10:26 AM) Simvastatin (20MG  Tablet, Oral) Active. Propranolol HCl (40MG  Tablet, Oral) Active. Irbesartan (150MG  Tablet, Oral) Active. Gabapentin (300MG  Capsule, Oral) Active. Aspirin (81MG  Tablet, Oral) Active. Lasix (20MG  Tablet, Oral as needed) Active. MiraLax (Oral as needed) Active. Vitamin D (50000UNIT Capsule, Oral weekly) Active. Neurontin (300MG  Capsule, Oral) Active. ClonazePAM (0.5MG  Tablet, Oral as  needed) Active. Medications Reconciled  Social History Leighton Ruff, MD; 08/16/9483 10:34 AM) Caffeine use Carbonated beverages, Coffee, Tea. No alcohol use No drug use Tobacco use Never smoker.  Family History Leighton Ruff, MD; 46/27/0350 10:34 AM) Breast Cancer Family Members In General, Sister. Cancer Father. Colon Cancer Family Members In General. Colon Polyps Family Members In General. Depression Son. Diabetes Mellitus Family Members In General, Mother. Heart Disease Brother, Family Members In General, Mother. Heart disease in female family member before age 22 Hypertension Mother. Migraine Headache Son. Prostate Cancer Brother, Father.  Pregnancy / Birth History Leighton Ruff, MD; [birth year]/38/1829 10:34 AM) Age at menarche 62 years. Age of menopause 61-50 Gravida 2 Maternal age 50-25 Para 3  Other Problems Leighton Ruff, MD; 93/71/6967 10:34 AM) Anxiety Disorder Arthritis Chronic Renal Failure Syndrome Gastroesophageal Reflux Disease Hemorrhoids High blood pressure Hypercholesterolemia     Review of Systems Leighton Ruff MD; 89/38/1017 10:34 AM) General Present- Weight Gain. Not Present- Appetite Loss, Chills, Fatigue, Fever, Night Sweats and Weight Loss. Skin Present- Dryness. Not Present- Change in Wart/Mole, Hives, Jaundice, New Lesions, Non-Healing Wounds, Rash and Ulcer. HEENT Present- Hearing Loss and Ringing in the Ears. Not Present- Earache, Hoarseness, Nose Bleed, Oral Ulcers, Seasonal Allergies, Sinus Pain, Sore Throat, Visual Disturbances, Wears glasses/contact lenses and Yellow Eyes. Respiratory Present- Snoring. Not Present- Bloody sputum, Chronic Cough, Difficulty Breathing and Wheezing. Breast Not Present- Breast Mass, Breast Pain, Nipple Discharge and Skin Changes. Cardiovascular Present- Swelling of Extremities. Not Present- Chest Pain, Difficulty Breathing Lying Down, Leg Cramps, Palpitations, Rapid Heart Rate and  Shortness of Breath. Gastrointestinal Present- Bloating. Not Present- Abdominal Pain, Bloody Stool, Change in Bowel Habits, Chronic diarrhea, Constipation, Difficulty Swallowing, Excessive gas, Gets full quickly at meals, Hemorrhoids, Indigestion,  Nausea, Rectal Pain and Vomiting. Female Genitourinary Present- Frequency and Urgency. Not Present- Nocturia, Painful Urination and Pelvic Pain. Musculoskeletal Present- Joint Stiffness and Muscle Weakness. Not Present- Back Pain, Joint Pain, Muscle Pain and Swelling of Extremities. Neurological Present- Tremor. Not Present- Decreased Memory, Fainting, Headaches, Numbness, Seizures, Tingling, Trouble walking and Weakness. Psychiatric Present- Anxiety. Not Present- Bipolar, Change in Sleep Pattern, Depression, Fearful and Frequent crying.  Vitals Sabino Gasser CMA; 11/29/2017 10:27 AM) 11/29/2017 10:26 AM Weight: 254 lb Height: 64.5in Body Surface Area: 2.18 m Body Mass Index: 42.93 kg/m  Temp.: 98.73F(Oral)  Pulse: 75 (Regular)  BP: 132/84 (Sitting, Left Arm, Standard)      Physical Exam Leighton Ruff MD; 62/94/7654 10:45 AM)  General Mental Status-Alert. General Appearance-Not in acute distress. Build & Nutrition-Well nourished. Posture-Normal posture. Gait-Normal.  Head and Neck Head-normocephalic, atraumatic with no lesions or palpable masses. Trachea-midline.  Chest and Lung Exam Chest and lung exam reveals -on auscultation, normal breath sounds, no adventitious sounds and normal vocal resonance.  Cardiovascular Cardiovascular examination reveals -normal heart sounds, regular rate and rhythm with no murmurs and no digital clubbing, cyanosis, edema, increased warmth or tenderness.  Abdomen Inspection Inspection of the abdomen reveals - No Hernias. Palpation/Percussion Palpation and Percussion of the abdomen reveal - Soft, Non Tender, No Rigidity (guarding), No hepatosplenomegaly and No Palpable  abdominal masses.  Rectal Anorectal Exam External - Note: seton in place, min purulent drainage.  Neurologic Neurologic evaluation reveals -alert and oriented x 3 with no impairment of recent or remote memory, normal attention span and ability to concentrate, normal sensation and normal coordination.  Musculoskeletal Normal Exam - Bilateral-Upper Extremity Strength Normal and Lower Extremity Strength Normal.    Assessment & Plan Leighton Ruff MD; 65/04/5463 10:49 AM)  ANAL FISTULA (K60.3) Impression: 67 year old female with complex transsphincteric fistula who presents to the office for evaluation after seton placement. He seems to have healed well from her previous surgery and is now ready to undergo a definitive procedure. We discussed the multiple options including removing the seton versus fistulotomy versus a sphincter sparing procedure-like ligation of internal fistula tract. We have decided to proceed with ligation of internal fistula tract as it provides the least risk of incontinence with the best chance of resolving the fistula (80%).

## 2017-11-29 NOTE — H&P (View-Only) (Signed)
History of Present Illness Leighton Ruff MD; 29/51/8841 10:34 AM) The patient is a 67 year old female who presents with anal fistula. 67 year old female who presented to the office for evaluation of a perianal lesion. She states approximate 3 years ago she noticed that she had a hard bowel movement and felt a tearing sensation. Several days later she noticed a bulge at the base of her labia which opened up and drained purulence for approximately 3 days. Since then she has had a chronically draining wound in that spot. It is been biopsied on 2 different occasions by Dr. Sabra Heck, both of which returned benign. Dr. Sabra Heck was able to place a probe that traversed towards the anus on exam. She was concern for perianal fistula and sent her to me for evaluation. Patient has a history of constipation. She underwent anal EUA and a deep anterior anal fistula was noted. A seton was placed in early Aug 18.    Problem List/Past Medical Leighton Ruff, MD; 66/07/3014 10:34 AM) ANAL FISTULA (K60.3)  Past Surgical History Leighton Ruff, MD; 02/28/3233 10:34 AM) Cataract Surgery Bilateral. Cesarean Section - 1 Colon Polyp Removal - Colonoscopy Oral Surgery  Diagnostic Studies History Leighton Ruff, MD; 57/32/2025 10:34 AM) Colonoscopy within last year Mammogram within last year Pap Smear 1-5 years ago  Allergies Sabino Gasser, [place]; 11/29/2017 10:26 AM) No Known Allergies [09/24/2017]: Allergies Reconciled  Medication History Sabino Gasser, CMA; 11/29/2017 10:26 AM) Simvastatin (20MG  Tablet, Oral) Active. Propranolol HCl (40MG  Tablet, Oral) Active. Irbesartan (150MG  Tablet, Oral) Active. Gabapentin (300MG  Capsule, Oral) Active. Aspirin (81MG  Tablet, Oral) Active. Lasix (20MG  Tablet, Oral as needed) Active. MiraLax (Oral as needed) Active. Vitamin D (50000UNIT Capsule, Oral weekly) Active. Neurontin (300MG  Capsule, Oral) Active. ClonazePAM (0.5MG  Tablet, Oral as  needed) Active. Medications Reconciled  Social History Leighton Ruff, MD; 42/70/6237 10:34 AM) Caffeine use Carbonated beverages, Coffee, Tea. No alcohol use No drug use Tobacco use Never smoker.  Family History Leighton Ruff, MD; 62/83/1517 10:34 AM) Breast Cancer Family Members In General, Sister. Cancer Father. Colon Cancer Family Members In General. Colon Polyps Family Members In General. Depression Son. Diabetes Mellitus Family Members In General, Mother. Heart Disease Brother, Family Members In General, Mother. Heart disease in female family member before age 58 Hypertension Mother. Migraine Headache Son. Prostate Cancer Brother, Father.  Pregnancy / Birth History Leighton Ruff, MD; 61/60/7371 10:34 AM) Age at menarche 24 years. Age of menopause 89-50 Gravida 2 Maternal age 78-25 Para 78  Other Problems Leighton Ruff, MD; 08/16/9483 10:34 AM) Anxiety Disorder Arthritis Chronic Renal Failure Syndrome Gastroesophageal Reflux Disease Hemorrhoids High blood pressure Hypercholesterolemia     Review of Systems Leighton Ruff MD; 46/27/0350 10:34 AM) General Present- Weight Gain. Not Present- Appetite Loss, Chills, Fatigue, Fever, Night Sweats and Weight Loss. Skin Present- Dryness. Not Present- Change in Wart/Mole, Hives, Jaundice, New Lesions, Non-Healing Wounds, Rash and Ulcer. HEENT Present- Hearing Loss and Ringing in the Ears. Not Present- Earache, Hoarseness, Nose Bleed, Oral Ulcers, Seasonal Allergies, Sinus Pain, Sore Throat, Visual Disturbances, Wears glasses/contact lenses and Yellow Eyes. Respiratory Present- Snoring. Not Present- Bloody sputum, Chronic Cough, Difficulty Breathing and Wheezing. Breast Not Present- Breast Mass, Breast Pain, Nipple Discharge and Skin Changes. Cardiovascular Present- Swelling of Extremities. Not Present- Chest Pain, Difficulty Breathing Lying Down, Leg Cramps, Palpitations, Rapid Heart Rate and  Shortness of Breath. Gastrointestinal Present- Bloating. Not Present- Abdominal Pain, Bloody Stool, Change in Bowel Habits, Chronic diarrhea, Constipation, Difficulty Swallowing, Excessive gas, Gets full quickly at meals, Hemorrhoids, Indigestion,  Nausea, Rectal Pain and Vomiting. Female Genitourinary Present- Frequency and Urgency. Not Present- Nocturia, Painful Urination and Pelvic Pain. Musculoskeletal Present- Joint Stiffness and Muscle Weakness. Not Present- Back Pain, Joint Pain, Muscle Pain and Swelling of Extremities. Neurological Present- Tremor. Not Present- Decreased Memory, Fainting, Headaches, Numbness, Seizures, Tingling, Trouble walking and Weakness. Psychiatric Present- Anxiety. Not Present- Bipolar, Change in Sleep Pattern, Depression, Fearful and Frequent crying.  Vitals Sabino Gasser CMA; 11/29/2017 10:27 AM) 11/29/2017 10:26 AM Weight: 254 lb Height: 64.5in Body Surface Area: 2.18 m Body Mass Index: 42.93 kg/m  Temp.: 98.36F(Oral)  Pulse: 75 (Regular)  BP: 132/84 (Sitting, Left Arm, Standard)      Physical Exam Leighton Ruff MD; 01/60/1093 10:45 AM)  General Mental Status-Alert. General Appearance-Not in acute distress. Build & Nutrition-Well nourished. Posture-Normal posture. Gait-Normal.  Head and Neck Head-normocephalic, atraumatic with no lesions or palpable masses. Trachea-midline.  Chest and Lung Exam Chest and lung exam reveals -on auscultation, normal breath sounds, no adventitious sounds and normal vocal resonance.  Cardiovascular Cardiovascular examination reveals -normal heart sounds, regular rate and rhythm with no murmurs and no digital clubbing, cyanosis, edema, increased warmth or tenderness.  Abdomen Inspection Inspection of the abdomen reveals - No Hernias. Palpation/Percussion Palpation and Percussion of the abdomen reveal - Soft, Non Tender, No Rigidity (guarding), No hepatosplenomegaly and No Palpable  abdominal masses.  Rectal Anorectal Exam External - Note: seton in place, min purulent drainage.  Neurologic Neurologic evaluation reveals -alert and oriented x 3 with no impairment of recent or remote memory, normal attention span and ability to concentrate, normal sensation and normal coordination.  Musculoskeletal Normal Exam - Bilateral-Upper Extremity Strength Normal and Lower Extremity Strength Normal.    Assessment & Plan Leighton Ruff MD; 23/55/7322 10:49 AM)  ANAL FISTULA (K60.3) Impression: 67 year old female with complex transsphincteric fistula who presents to the office for evaluation after seton placement. He seems to have healed well from her previous surgery and is now ready to undergo a definitive procedure. We discussed the multiple options including removing the seton versus fistulotomy versus a sphincter sparing procedure-like ligation of internal fistula tract. We have decided to proceed with ligation of internal fistula tract as it provides the least risk of incontinence with the best chance of resolving the fistula (80%).

## 2017-12-14 ENCOUNTER — Encounter (HOSPITAL_BASED_OUTPATIENT_CLINIC_OR_DEPARTMENT_OTHER): Payer: Self-pay | Admitting: *Deleted

## 2017-12-14 ENCOUNTER — Other Ambulatory Visit: Payer: Self-pay

## 2017-12-14 NOTE — Progress Notes (Addendum)
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

## 2017-12-18 DIAGNOSIS — R69 Illness, unspecified: Secondary | ICD-10-CM | POA: Diagnosis not present

## 2017-12-21 ENCOUNTER — Ambulatory Visit (HOSPITAL_BASED_OUTPATIENT_CLINIC_OR_DEPARTMENT_OTHER): Payer: Medicare HMO | Admitting: Anesthesiology

## 2017-12-21 ENCOUNTER — Encounter (HOSPITAL_BASED_OUTPATIENT_CLINIC_OR_DEPARTMENT_OTHER): Payer: Self-pay | Admitting: Anesthesiology

## 2017-12-21 ENCOUNTER — Other Ambulatory Visit: Payer: Self-pay

## 2017-12-21 ENCOUNTER — Ambulatory Visit (HOSPITAL_BASED_OUTPATIENT_CLINIC_OR_DEPARTMENT_OTHER)
Admission: RE | Admit: 2017-12-21 | Discharge: 2017-12-21 | Disposition: A | Discharge status: Home / Self Care | Payer: Medicare HMO | Source: Ambulatory Visit | Attending: General Surgery | Admitting: General Surgery

## 2017-12-21 ENCOUNTER — Encounter (HOSPITAL_BASED_OUTPATIENT_CLINIC_OR_DEPARTMENT_OTHER)
Admission: RE | Disposition: A | Discharge status: Home / Self Care | Payer: Self-pay | Source: Ambulatory Visit | Attending: General Surgery

## 2017-12-21 DIAGNOSIS — E7800 Pure hypercholesterolemia, unspecified: Secondary | ICD-10-CM | POA: Diagnosis not present

## 2017-12-21 DIAGNOSIS — Z79899 Other long term (current) drug therapy: Secondary | ICD-10-CM | POA: Insufficient documentation

## 2017-12-21 DIAGNOSIS — Z7982 Long term (current) use of aspirin: Secondary | ICD-10-CM | POA: Insufficient documentation

## 2017-12-21 DIAGNOSIS — F419 Anxiety disorder, unspecified: Secondary | ICD-10-CM | POA: Insufficient documentation

## 2017-12-21 DIAGNOSIS — R69 Illness, unspecified: Secondary | ICD-10-CM | POA: Diagnosis not present

## 2017-12-21 DIAGNOSIS — I10 Essential (primary) hypertension: Secondary | ICD-10-CM | POA: Insufficient documentation

## 2017-12-21 DIAGNOSIS — Z6841 Body Mass Index (BMI) 40.0 and over, adult: Secondary | ICD-10-CM | POA: Diagnosis not present

## 2017-12-21 DIAGNOSIS — K219 Gastro-esophageal reflux disease without esophagitis: Secondary | ICD-10-CM | POA: Diagnosis not present

## 2017-12-21 DIAGNOSIS — K603 Anal fistula: Secondary | ICD-10-CM | POA: Diagnosis not present

## 2017-12-21 HISTORY — PX: LIGATION OF INTERNAL FISTULA TRACT: SHX6551

## 2017-12-21 HISTORY — DX: Tremor, unspecified: R25.1

## 2017-12-21 LAB — POCT I-STAT, CHEM 8
BUN: 18 mg/dL (ref 8–23)
CALCIUM ION: 1.21 mmol/L (ref 1.15–1.40)
CHLORIDE: 107 mmol/L (ref 98–111)
Creatinine, Ser: 1.1 mg/dL — ABNORMAL HIGH (ref 0.44–1.00)
GLUCOSE: 98 mg/dL (ref 70–99)
HCT: 41 % (ref 36.0–46.0)
Hemoglobin: 13.9 g/dL (ref 12.0–15.0)
POTASSIUM: 3.9 mmol/L (ref 3.5–5.1)
Sodium: 143 mmol/L (ref 135–145)
TCO2: 25 mmol/L (ref 22–32)

## 2017-12-21 SURGERY — LIGATION, INTERNAL FISTULA TRACT
Anesthesia: General

## 2017-12-21 MED ORDER — BUPIVACAINE-EPINEPHRINE 0.5% -1:200000 IJ SOLN
INTRAMUSCULAR | Status: DC | PRN
  Administered 2017-12-21: 50 mL

## 2017-12-21 MED ORDER — OXYCODONE HCL 5 MG PO TABS
5.0000 mg | ORAL_TABLET | ORAL | Status: DC | PRN
  Filled 2017-12-21: qty 2

## 2017-12-21 MED ORDER — LIDOCAINE 5 % EX OINT
TOPICAL_OINTMENT | CUTANEOUS | Status: AC
  Filled 2017-12-21: qty 35.44

## 2017-12-21 MED ORDER — ACETAMINOPHEN 500 MG PO TABS
1000.0000 mg | ORAL_TABLET | ORAL | Status: AC
  Administered 2017-12-21: 1000 mg via ORAL
  Filled 2017-12-21: qty 2

## 2017-12-21 MED ORDER — HYDROGEN PEROXIDE 3 % EX SOLN
CUTANEOUS | Status: DC | PRN
  Administered 2017-12-21: 1 via TOPICAL

## 2017-12-21 MED ORDER — SODIUM CHLORIDE 0.9 % IV SOLN
250.0000 mL | INTRAVENOUS | Status: DC | PRN
  Filled 2017-12-21: qty 250

## 2017-12-21 MED ORDER — SODIUM CHLORIDE 0.9 % IJ SOLN
INTRAMUSCULAR | Status: AC
  Filled 2017-12-21: qty 100

## 2017-12-21 MED ORDER — SODIUM CHLORIDE 0.9% FLUSH
3.0000 mL | Freq: Two times a day (BID) | INTRAVENOUS | Status: DC
  Filled 2017-12-21: qty 3

## 2017-12-21 MED ORDER — LIDOCAINE 5 % EX OINT
TOPICAL_OINTMENT | CUTANEOUS | Status: DC | PRN
  Administered 2017-12-21: 1 via TOPICAL

## 2017-12-21 MED ORDER — BUPIVACAINE LIPOSOME 1.3 % IJ SUSP
20.0000 mL | Freq: Once | INTRAMUSCULAR | Status: DC
  Filled 2017-12-21: qty 20

## 2017-12-21 MED ORDER — FENTANYL CITRATE (PF) 100 MCG/2ML IJ SOLN
INTRAMUSCULAR | Status: DC | PRN
  Administered 2017-12-21: 50 ug via INTRAVENOUS

## 2017-12-21 MED ORDER — LIDOCAINE 2% (20 MG/ML) 5 ML SYRINGE
INTRAMUSCULAR | Status: AC
  Filled 2017-12-21: qty 5

## 2017-12-21 MED ORDER — FENTANYL CITRATE (PF) 100 MCG/2ML IJ SOLN
25.0000 ug | INTRAMUSCULAR | Status: DC | PRN
  Filled 2017-12-21: qty 1

## 2017-12-21 MED ORDER — DEXAMETHASONE SODIUM PHOSPHATE 10 MG/ML IJ SOLN
INTRAMUSCULAR | Status: DC | PRN
  Administered 2017-12-21: 10 mg via INTRAVENOUS

## 2017-12-21 MED ORDER — ROCURONIUM BROMIDE 10 MG/ML (PF) SYRINGE
PREFILLED_SYRINGE | INTRAVENOUS | Status: DC | PRN
  Administered 2017-12-21: 60 mg via INTRAVENOUS

## 2017-12-21 MED ORDER — SODIUM CHLORIDE 0.9% FLUSH
3.0000 mL | INTRAVENOUS | Status: DC | PRN
  Filled 2017-12-21: qty 3

## 2017-12-21 MED ORDER — CELECOXIB 200 MG PO CAPS
ORAL_CAPSULE | ORAL | Status: AC
  Filled 2017-12-21: qty 1

## 2017-12-21 MED ORDER — FENTANYL CITRATE (PF) 100 MCG/2ML IJ SOLN
INTRAMUSCULAR | Status: AC
  Filled 2017-12-21: qty 2

## 2017-12-21 MED ORDER — PROPOFOL 10 MG/ML IV BOLUS
INTRAVENOUS | Status: AC
  Filled 2017-12-21: qty 20

## 2017-12-21 MED ORDER — PHENYLEPHRINE HCL 10 MG/ML IJ SOLN
INTRAMUSCULAR | Status: DC | PRN
  Administered 2017-12-21: 120 ug via INTRAVENOUS
  Administered 2017-12-21 (×6): 80 ug via INTRAVENOUS
  Administered 2017-12-21: 120 ug via INTRAVENOUS

## 2017-12-21 MED ORDER — BUPIVACAINE LIPOSOME 1.3 % IJ SUSP
INTRAMUSCULAR | Status: AC
  Filled 2017-12-21: qty 20

## 2017-12-21 MED ORDER — ACETAMINOPHEN 500 MG PO TABS
ORAL_TABLET | ORAL | Status: AC
  Filled 2017-12-21: qty 2

## 2017-12-21 MED ORDER — BUPIVACAINE LIPOSOME 1.3 % IJ SUSP
INTRAMUSCULAR | Status: DC | PRN
  Administered 2017-12-21: 20 mL

## 2017-12-21 MED ORDER — OXYCODONE HCL 5 MG PO TABS
5.0000 mg | ORAL_TABLET | Freq: Once | ORAL | Status: DC | PRN
  Filled 2017-12-21: qty 1

## 2017-12-21 MED ORDER — ARTIFICIAL TEARS OPHTHALMIC OINT
TOPICAL_OINTMENT | OPHTHALMIC | Status: AC
  Filled 2017-12-21: qty 3.5

## 2017-12-21 MED ORDER — ACETAMINOPHEN 325 MG PO TABS
650.0000 mg | ORAL_TABLET | ORAL | Status: DC | PRN
  Filled 2017-12-21: qty 2

## 2017-12-21 MED ORDER — PROPOFOL 10 MG/ML IV BOLUS
INTRAVENOUS | Status: DC | PRN
  Administered 2017-12-21: 200 mg via INTRAVENOUS

## 2017-12-21 MED ORDER — SUGAMMADEX SODIUM 200 MG/2ML IV SOLN
INTRAVENOUS | Status: AC
  Filled 2017-12-21: qty 2

## 2017-12-21 MED ORDER — MIDAZOLAM HCL 2 MG/2ML IJ SOLN
INTRAMUSCULAR | Status: AC
  Filled 2017-12-21: qty 2

## 2017-12-21 MED ORDER — OXYCODONE HCL 5 MG PO TABS: 5.0000 mg | ORAL_TABLET | ORAL | 0 refills | Status: DC | PRN

## 2017-12-21 MED ORDER — MIDAZOLAM HCL 2 MG/2ML IJ SOLN
INTRAMUSCULAR | Status: DC | PRN
  Administered 2017-12-21: 2 mg via INTRAVENOUS

## 2017-12-21 MED ORDER — SUGAMMADEX SODIUM 200 MG/2ML IV SOLN
INTRAVENOUS | Status: DC | PRN
  Administered 2017-12-21: 200 mg via INTRAVENOUS

## 2017-12-21 MED ORDER — OXYCODONE HCL 5 MG/5ML PO SOLN
5.0000 mg | Freq: Once | ORAL | Status: DC | PRN
  Filled 2017-12-21: qty 5

## 2017-12-21 MED ORDER — LIDOCAINE 2% (20 MG/ML) 5 ML SYRINGE
INTRAMUSCULAR | Status: DC | PRN
  Administered 2017-12-21: 100 mg via INTRAVENOUS

## 2017-12-21 MED ORDER — DEXAMETHASONE SODIUM PHOSPHATE 10 MG/ML IJ SOLN
INTRAMUSCULAR | Status: AC
  Filled 2017-12-21: qty 1

## 2017-12-21 MED ORDER — ONDANSETRON HCL 4 MG/2ML IJ SOLN
INTRAMUSCULAR | Status: AC
  Filled 2017-12-21: qty 2

## 2017-12-21 MED ORDER — ACETAMINOPHEN 650 MG RE SUPP
650.0000 mg | RECTAL | Status: DC | PRN
  Filled 2017-12-21: qty 1

## 2017-12-21 MED ORDER — SODIUM CHLORIDE 0.9 % IV SOLN
INTRAVENOUS | Status: DC
  Administered 2017-12-21: 09:00:00 via INTRAVENOUS
  Filled 2017-12-21: qty 1000

## 2017-12-21 MED ORDER — CELECOXIB 200 MG PO CAPS
200.0000 mg | ORAL_CAPSULE | ORAL | Status: AC
  Administered 2017-12-21: 200 mg via ORAL
  Filled 2017-12-21: qty 1

## 2017-12-21 MED ORDER — ONDANSETRON HCL 4 MG/2ML IJ SOLN
4.0000 mg | Freq: Once | INTRAMUSCULAR | Status: DC | PRN
  Filled 2017-12-21: qty 2

## 2017-12-21 MED ORDER — ONDANSETRON HCL 4 MG/2ML IJ SOLN
INTRAMUSCULAR | Status: DC | PRN
  Administered 2017-12-21: 4 mg via INTRAVENOUS

## 2017-12-21 MED ORDER — ROCURONIUM BROMIDE 10 MG/ML (PF) SYRINGE
PREFILLED_SYRINGE | INTRAVENOUS | Status: AC
  Filled 2017-12-21: qty 10

## 2017-12-21 SURGICAL SUPPLY — 49 items
APL SKNCLS STERI-STRIP NONHPOA (GAUZE/BANDAGES/DRESSINGS) ×1
BENZOIN TINCTURE PRP APPL 2/3 (GAUZE/BANDAGES/DRESSINGS) ×3 IMPLANT
BLADE EXTENDED COATED 6.5IN (ELECTRODE) IMPLANT
BLADE HEX COATED 2.75 (ELECTRODE) ×2 IMPLANT
BLADE SURG 10 STRL SS (BLADE) ×2 IMPLANT
BRIEF STRETCH FOR OB PAD LRG (UNDERPADS AND DIAPERS) ×2 IMPLANT
CANISTER SUCT 3000ML PPV (MISCELLANEOUS) ×2 IMPLANT
COVER BACK TABLE 60X90IN (DRAPES) ×2 IMPLANT
COVER MAYO STAND STRL (DRAPES) ×2 IMPLANT
COVER WAND RF STERILE (DRAPES) ×3 IMPLANT
DRAPE LAPAROTOMY 100X72 PEDS (DRAPES) ×2 IMPLANT
DRAPE UTILITY XL STRL (DRAPES) ×2 IMPLANT
ELECT REM PT RETURN 9FT ADLT (ELECTROSURGICAL) ×2
ELECTRODE REM PT RTRN 9FT ADLT (ELECTROSURGICAL) ×1 IMPLANT
GAUZE SPONGE 4X4 12PLY STRL (GAUZE/BANDAGES/DRESSINGS) ×2 IMPLANT
GLOVE BIO SURGEON STRL SZ 6.5 (GLOVE) ×2 IMPLANT
GLOVE BIOGEL PI IND STRL 7.0 (GLOVE) ×1 IMPLANT
GLOVE BIOGEL PI INDICATOR 7.0 (GLOVE) ×1
GOWN SPEC L3 XXLG W/TWL (GOWN DISPOSABLE) ×2 IMPLANT
HYDROGEN PEROXIDE 16OZ (MISCELLANEOUS) IMPLANT
IV CATH 14GX2 1/4 (CATHETERS) ×1 IMPLANT
IV CATH 18G SAFETY (IV SOLUTION) ×1 IMPLANT
KIT TURNOVER CYSTO (KITS) ×2 IMPLANT
NEEDLE HYPO 22GX1.5 SAFETY (NEEDLE) ×2 IMPLANT
NS IRRIG 500ML POUR BTL (IV SOLUTION) ×2 IMPLANT
PACK BASIN DAY SURGERY FS (CUSTOM PROCEDURE TRAY) ×2 IMPLANT
PAD ABD 8X10 STRL (GAUZE/BANDAGES/DRESSINGS) ×2 IMPLANT
PAD ARMBOARD 7.5X6 YLW CONV (MISCELLANEOUS) ×1 IMPLANT
PENCIL BUTTON HOLSTER BLD 10FT (ELECTRODE) ×2 IMPLANT
RETRACTOR STAY HOOK 5MM (MISCELLANEOUS) ×1 IMPLANT
RETRACTOR STERILE 25.8CMX11.3 (INSTRUMENTS) ×1 IMPLANT
SPONGE SURGIFOAM ABS GEL 12-7 (HEMOSTASIS) IMPLANT
SUCTION FRAZIER HANDLE 10FR (MISCELLANEOUS) ×1
SUCTION TUBE FRAZIER 10FR DISP (MISCELLANEOUS) IMPLANT
SUT CHROMIC 2 0 SH (SUTURE) IMPLANT
SUT CHROMIC 3 0 SH 27 (SUTURE) IMPLANT
SUT SILK 2 0 (SUTURE) ×2
SUT SILK 2-0 18XBRD TIE 12 (SUTURE) ×1 IMPLANT
SUT SILK 3 0 SH 30 (SUTURE) IMPLANT
SUT VIC AB 2-0 SH 27 (SUTURE)
SUT VIC AB 2-0 SH 27XBRD (SUTURE) IMPLANT
SUT VIC AB 3-0 SH 8-18 (SUTURE) ×3 IMPLANT
SUT VICRYL 3 0 UR 6 27 (SUTURE) IMPLANT
SYR BULB IRRIGATION 50ML (SYRINGE) ×2 IMPLANT
SYR CONTROL 10ML LL (SYRINGE) ×2 IMPLANT
TOWEL OR 17X24 6PK STRL BLUE (TOWEL DISPOSABLE) ×2 IMPLANT
TRAY DSU PREP LF (CUSTOM PROCEDURE TRAY) ×2 IMPLANT
TUBE CONNECTING 12X1/4 (SUCTIONS) ×2 IMPLANT
YANKAUER SUCT BULB TIP NO VENT (SUCTIONS) ×2 IMPLANT

## 2017-12-21 NOTE — Discharge Instructions (Addendum)
ANORECTAL SURGERY: POST OP INSTRUCTIONS °1. Take your usually prescribed home medications unless otherwise directed. °2. DIET: During the first few hours after surgery sip on some liquids until you are able to urinate.  It is normal to not urinate for several hours after this surgery.  If you feel uncomfortable, please contact the office for instructions.  After you are able to urinate,you may eat, if you feel like it.  Follow a light bland diet the first 24 hours after arrival home, such as soup, liquids, crackers, etc.  Be sure to include lots of fluids daily (6-8 glasses).  Avoid fast food or heavy meals, as your are more likely to get nauseated.  Eat a low fat diet the next few days after surgery.  Limit caffeine intake to 1-2 servings a day. °3. PAIN CONTROL: °a. Pain is best controlled by a usual combination of several different methods TOGETHER: °i. Muscle relaxation: Soak in a warm bath (or Sitz bath) three times a day and after bowel movements.  Continue to do this until all pain is resolved. °ii. Over the counter pain medication °iii. Prescription pain medication °b. Most patients will experience some swelling and discomfort in the anus/rectal area and incisions.  Heat such as warm towels, sitz baths, warm baths, etc to help relax tight/sore spots and speed recovery.  Some people prefer to use ice, especially in the first couple days after surgery, as it may decrease the pain and swelling, or alternate between ice & heat.  Experiment to what works for you.  Swelling and bruising can take several weeks to resolve.  Pain can take even longer to completely resolve. °c. It is helpful to take an over-the-counter pain medication regularly for the first few weeks.  Choose one of the following that works best for you: °i. Naproxen (Aleve, etc)  Two 220mg tabs twice a day °ii. Ibuprofen (Advil, etc) Three 200mg tabs four times a day (every meal & bedtime) °d. A  prescription for pain medication (such as percocet,  oxycodone, hydrocodone, etc) should be given to you upon discharge.  Take your pain medication as prescribed.  °i. If you are having problems/concerns with the prescription medicine (does not control pain, nausea, vomiting, rash, itching, etc), please call us [phone] to see if we need to switch you to a different pain medicine that will work better for you and/or control your side effect better. °ii. If you need a refill on your pain medication, please contact your pharmacy.  They will contact our office to request authorization. Prescriptions will not be filled after 5 pm or on week-ends. °4. KEEP YOUR BOWELS REGULAR and AVOID CONSTIPATION °a. The goal is one to two soft bowel movements a day.  You should at least have a bowel movement every other day. °b. Avoid getting constipated.  Between the surgery and the pain medications, it is common to experience some constipation. This can be very painful after rectal surgery.  Increasing fluid intake and taking a fiber supplement (such as Metamucil, Citrucel, FiberCon, etc) 1-2 times a day regularly will usually help prevent this problem from occurring.  A stool softener like colace is also recommended.  This can be purchased over the counter at your pharmacy.  You can take it up to 3 times a day.  If you do not have a bowel movement after 24 hrs since your surgery, take one does of milk of magnesia.  If you still haven't had a bowel movement 8-12 hours after   that dose, take another dose.  If you don't have a bowel movement 48 hrs after surgery, purchase a Fleets enema from the drug store and administer gently per package instructions.  If you still are having trouble with your bowel movements after that, please call the office for further instructions. °c. If you develop diarrhea or have many loose bowel movements, simplify your diet to bland foods & liquids for a few days.  Stop any stool softeners and decrease your fiber supplement.  Switching to mild  anti-diarrheal medications (Kayopectate, Pepto Bismol) can help.  If this worsens or does not improve, please call us. ° °5. Wound Care °a. Remove your bandages before your first bowel movement or 8 hours after surgery.     °b. Remove any wound packing material at this tim,e as well.  You do not need to repack the wound unless instructed otherwise.  Wear an absorbent pad or soft cotton gauze in your underwear to catch any drainage and help keep the area clean. You should change this every 2-3 hours while awake. °c. Keep the area clean and dry.  Bathe / shower every day, especially after bowel movements.  Keep the area clean by showering / bathing over the incision / wound.   It is okay to soak an open wound to help wash it.  Wet wipes or showers / gentle washing after bowel movements is often less traumatic than regular toilet paper. °d. You may have some styrofoam-like soft packing in the rectum which will come out with the first bowel movement.  °e. You will often notice bleeding with bowel movements.  This should slow down by the end of the first week of surgery °f. Expect some drainage.  This should slow down, too, by the end of the first week of surgery.  Wear an absorbent pad or soft cotton gauze in your underwear until the drainage stops. °g. Do Not sit on a rubber or pillow ring.  This can make you symptoms worse.  You may sit on a soft pillow if needed.  °6. ACTIVITIES as tolerated:   °a. You may resume regular (light) daily activities beginning the next day--such as daily self-care, walking, climbing stairs--gradually increasing activities as tolerated.  If you can walk 30 minutes without difficulty, it is safe to try more intense activity such as jogging, treadmill, bicycling, low-impact aerobics, swimming, etc. °b. Save the most intensive and strenuous activity for last such as sit-ups, heavy lifting, contact sports, etc  Refrain from any heavy lifting or straining until you are off narcotics for pain  control.   °c. You may drive when you are no longer taking prescription pain medication, you can comfortably sit for long periods of time, and you can safely maneuver your car and apply brakes. °d. You may have sexual intercourse when it is comfortable.  °7. FOLLOW UP in our office °a. Please call [facility] at [phone] to set up an appointment to see your surgeon in the office for a follow-up appointment approximately 3-4 weeks after your surgery. °b. Make sure that you call for this appointment the day you arrive home to insure a convenient appointment time. °10. IF YOU HAVE DISABILITY OR FAMILY LEAVE FORMS, BRING THEM TO THE OFFICE FOR PROCESSING.  DO NOT GIVE THEM TO YOUR DOCTOR. ° ° ° ° °WHEN TO CALL US [phone]: °1. Poor pain control °2. Reactions / problems with new medications (rash/itching, nausea, etc)  °3. Fever over 101.5 F (38.5 C) °4.   Inability to urinate 5. Nausea and/or vomiting 6. Worsening swelling or bruising 7. Continued bleeding from incision. 8. Increased pain, redness, or drainage from the incision  The clinic staff is available to answer your questions during regular business hours (8:30am-5pm).  Please dont hesitate to call and ask to speak to one of our nurses for clinical concerns.   A surgeon from [facility] is always on call at the hospitals   If you have a medical emergency, go to the nearest emergency room or call 911.    [facility], [address], [place], [place], [place]  [address] ? MAIN: [phone] ? TOLL FREE: [phone] ? FAX [phone] [email or web address]  Advil,Aleve, Motrin as needed after 2 pm   Post Anesthesia Home Care Instructions  Activity: Get plenty of rest for the remainder of the day. A responsible individual must stay with you for 24 hours following the procedure.  For the next 24 hours, DO NOT: -Drive a car -Paediatric nurse -Drink alcoholic beverages -Take any  medication unless instructed by your physician -Make any legal decisions or sign important papers.  Meals: Start with liquid foods such as gelatin or soup. Progress to regular foods as tolerated. Avoid greasy, spicy, heavy foods. If nausea and/or vomiting occur, drink only clear liquids until the nausea and/or vomiting subsides. Call your physician if vomiting continues.  Special Instructions/Symptoms: Your throat may feel dry or sore from the anesthesia or the breathing tube placed in your throat during surgery. If this causes discomfort, gargle with warm salt water. The discomfort should disappear within 24 hours.  If you had a scopolamine patch placed behind your ear for the management of post- operative nausea and/or vomiting:  1. The medication in the patch is effective for 72 hours, after which it should be removed.  Wrap patch in a tissue and discard in the trash. Wash hands thoroughly with soap and water. 2. You may remove the patch earlier than 72 hours if you experience unpleasant side effects which may include dry mouth, dizziness or visual disturbances. 3. Avoid touching the patch. Wash your hands with soap and water after contact with the patch.   Information for Discharge Teaching: EXPAREL (bupivacaine liposome injectable suspension)   Your surgeon or anesthesiologist gave you EXPAREL(bupivacaine) to help control your pain after surgery.   EXPAREL is a local anesthetic that provides pain relief by numbing the tissue around the surgical site.  EXPAREL is designed to release pain medication over time and can control pain for up to 72 hours.  Depending on how you respond to EXPAREL, you may require less pain medication during your recovery.  Possible side effects:  Temporary loss of sensation or ability to move in the area where bupivacaine was injected.  Nausea, vomiting, constipation  Rarely, numbness and tingling in your mouth or lips, lightheadedness, or anxiety may  occur.  Call your doctor right away if you think you may be experiencing any of these sensations, or if you have other questions regarding possible side effects.  Follow all other discharge instructions given to you by your surgeon or nurse. Eat a healthy diet and drink plenty of water or other fluids.  If you return to the hospital for any reason within 96 hours following the administration of EXPAREL, it is important for health care providers to know that you have received this anesthetic. A teal colored band has been placed on your arm with the date, time and amount of EXPAREL  you have received in order to alert and inform your health care providers. Please leave this armband in place for the full 96 hours following administration, and then you may remove the band.

## 2017-12-21 NOTE — Interval H&P Note (Signed)
History and Physical Interval Note:  12/21/2017 8:00 AM  Doris French  has presented today for surgery, with the diagnosis of anal fistula  The various methods of treatment have been discussed with the patient and family. After consideration of risks, benefits and other options for treatment, the patient has consented to  Procedure(s): LIGATION OF INTERNAL FISTULA TRACT (N/A) as a surgical intervention .  The patient's history has been reviewed, patient examined, no change in status, stable for surgery.  I have reviewed the patient's chart and labs.  Questions were answered to the patient's satisfaction.  She is aware of the risks, which include bleeding, pain, a small chance of incontinence and a 20% chance of recurrence.     Rosario Adie, MD  [facility]

## 2017-12-21 NOTE — Transfer of Care (Signed)
Immediate Anesthesia Transfer of Care Note  Patient: Doris French  Procedure(s) Performed: LIGATION OF INTERNAL FISTULA TRACT (N/A )  Patient Location: PACU  Anesthesia Type:General  Level of Consciousness: awake, alert , oriented and patient cooperative  Airway & Oxygen Therapy: Patient Spontanous Breathing and Patient connected to nasal cannula oxygen  Post-op Assessment: Report given to RN and Post -op Vital signs reviewed and stable  Post vital signs: Reviewed and stable  Last Vitals:  Vitals Value Taken Time  BP    Temp    Pulse    Resp    SpO2      Last Pain:  Vitals:   12/21/17 0757  TempSrc: Oral      Patients Stated Pain Goal: 4 (96/28/36 6294)  Complications: No apparent anesthesia complications

## 2017-12-21 NOTE — Anesthesia Postprocedure Evaluation (Signed)
Anesthesia Post Note  Patient: Doris French  Procedure(s) Performed: LIGATION OF INTERNAL FISTULA TRACT (N/A )     Patient location during evaluation: PACU Anesthesia Type: General Level of consciousness: awake and alert Pain management: pain level controlled Vital Signs Assessment: post-procedure vital signs reviewed and stable Respiratory status: spontaneous breathing, nonlabored ventilation and respiratory function stable Cardiovascular status: blood pressure returned to baseline and stable Postop Assessment: no apparent nausea or vomiting Anesthetic complications: no    Last Vitals:  Vitals:   12/21/17 1145 12/21/17 1200  BP: (!) 144/79 128/65  Pulse: (!) 59 66  Resp: 13 (!) 21  Temp:  36.4 C  SpO2: 100% 99%    Last Pain:  Vitals:   12/21/17 1130  TempSrc:   PainSc: 0-No pain                 [name]

## 2017-12-21 NOTE — Anesthesia Preprocedure Evaluation (Addendum)
Anesthesia Evaluation  Patient identified by MRN, date of birth, ID band Patient awake    Reviewed: Allergy & Precautions, NPO status , Patient's Chart, lab work & pertinent test results  History of Anesthesia Complications Negative for: history of anesthetic complications  Airway Mallampati: I  TM Distance: >3 FB Neck ROM: Full    Dental no notable dental hx. (+) Caps, Dental Advisory Given,    Pulmonary neg pulmonary ROS,    Pulmonary exam normal        Cardiovascular hypertension, Pt. on medications and Pt. on home beta blockers Normal cardiovascular exam     Neuro/Psych PSYCHIATRIC DISORDERS Anxiety Hx of R acoustic neuroma s/p resection with some residual hearing loss and balance issues    GI/Hepatic Neg liver ROS, GERD  Medicated and Controlled,  Endo/Other  Morbid obesity  Renal/GU Renal InsufficiencyRenal disease  negative genitourinary   Musculoskeletal  (+) Arthritis , Osteoarthritis,    Abdominal   Peds  Hematology negative hematology ROS (+)   Anesthesia Other Findings   Reproductive/Obstetrics                          Anesthesia Physical Anesthesia Plan  ASA: III  Anesthesia Plan: General   Post-op Pain Management:    Induction: Intravenous  PONV Risk Score and Plan: 3 and Ondansetron, Dexamethasone, Midazolam and Treatment may vary due to age or medical condition  Airway Management Planned: Oral ETT  Additional Equipment: None  Intra-op Plan:   Post-operative Plan: Extubation in OR  Informed Consent: I have reviewed the patients History and Physical, chart, labs and discussed the procedure including the risks, benefits and alternatives for the proposed anesthesia with the patient or authorized representative who has indicated his/her understanding and acceptance.     Plan Discussed with:   Anesthesia Plan Comments:        Anesthesia Quick Evaluation

## 2017-12-21 NOTE — Anesthesia Procedure Notes (Signed)
Procedure Name: Intubation Date/Time: 12/21/2017 9:26 AM Performed by: Wanita Chamberlain, CRNA Pre-anesthesia Checklist: Patient identified, Emergency Drugs available, Suction available, Patient being monitored and Timeout performed Patient Re-evaluated:Patient Re-evaluated prior to induction Oxygen Delivery Method: Circle system utilized Preoxygenation: Pre-oxygenation with 100% oxygen Induction Type: IV induction Ventilation: Mask ventilation without difficulty Laryngoscope Size: Mac and 3 Grade View: Grade I Tube type: Oral Tube size: 7.0 mm Number of attempts: 1 Airway Equipment and Method: Stylet Placement Confirmation: CO2 detector,  positive ETCO2 and ETT inserted through vocal cords under direct vision Secured at: 21 cm Tube secured with: Tape Dental Injury: Teeth and Oropharynx as per pre-operative assessment

## 2017-12-21 NOTE — Op Note (Signed)
12/21/2017  11:00 AM  PATIENT:  Doris French  67 y.o. female  Patient Care Team: Lawerance Cruel, MD as PCP - General (Family Medicine) Izora Gala, MD as Referring Physician (Otolaryngology)  PRE-OPERATIVE DIAGNOSIS:  anal fistula  POST-OPERATIVE DIAGNOSIS:  anal fistula  PROCEDURE:  LIGATION OF INTERNAL FISTULA TRACT    Surgeon(s): Leighton Ruff, MD  ASSISTANT: none   ANESTHESIA:   local and MAC  SPECIMEN:  No Specimen  DISPOSITION OF SPECIMEN:  N/A  COUNTS:  YES  PLAN OF CARE: Discharge to home after PACU  PATIENT DISPOSITION:  PACU - hemodynamically stable.  INDICATION: 67 y.o. F with trans-sphincteric fistula   OR FINDINGS: Seton in place, deep anterior midline cavity  DESCRIPTION: the patient was identified in the preoperative holding area and taken to the OR where they were laid on the operating room table.  General anesthesia was induced without difficulty. The patient was then positioned in prone jackknife position with buttocks gently taped apart.  The patient was then prepped and draped in usual sterile fashion.  SCDs were noted to be in place prior to the initiation of anesthesia. A surgical timeout was performed indicating the correct patient, procedure, positioning and need for preoperative antibiotics.  A rectal block was performed using Marcaine with epinephrine mixed with Experel.    I began with a digital rectal exam.  Sphincter tone was good.  There were no masses noted.  Internal opening was approximately at the dentate line.  I then placed a Hill-Ferguson anoscope into the anal canal and evaluated this completely.  Patient had a grade 2 right anterior internal hemorrhoid.  I placed a fistula probe through the fistula tract and remove the seton.  I then made an incision in the intersphincteric groove and divided down between the external and internal sphincters using blunt dissection electrocautery.  I used a [employer] worker, community to retract the skin  flaps out of the way.  I dissected underneath of the fistula tract using a right angle clamp.  I placed a 2-0 silk suture underneath of the fistula tract and remove the fistula probe.  I then tied a 2-0 silk suture distally and transected the fistula tract.  Underneath this there appeared to be a second fistula tract.  I dissected around this using the right angle clamp and brought out another 2-0 silk suture underneath of this.  This was then tied off and ligated as well.  The internal portion was then ligated with figure-of-eight 2-0 Vicryl sutures.  I used hydrogen peroxide to identify any leaks in the fistula tract.  When I found the leak I would place a 3-0 Vicryl suture to close this.  Once the fistula tract was relatively watertight, I closed the intersphincteric space using interrupted 3-0 Vicryl sutures.  I placed an additional 2-0 chromic suture over the internal opening inside the anal canal using figure-of-eight technique.  I then closed the incision using interrupted 2-0 chromic sutures.  The external opening was then enlarged using electrocautery and left open to drain.  Additional Marcaine with epinephrine was placed around the incision for additional postoperative pain control.  The patient was then awakened from anesthesia and sent to the postanesthesia care in stable condition.  All counts were correct per operating room staff.

## 2017-12-24 ENCOUNTER — Encounter (HOSPITAL_BASED_OUTPATIENT_CLINIC_OR_DEPARTMENT_OTHER): Payer: Self-pay | Admitting: General Surgery

## 2018-02-05 ENCOUNTER — Other Ambulatory Visit: Payer: Self-pay | Admitting: Family Medicine

## 2018-02-05 DIAGNOSIS — Z1231 Encounter for screening mammogram for malignant neoplasm of breast: Secondary | ICD-10-CM

## 2018-02-21 ENCOUNTER — Telehealth: Payer: Self-pay | Admitting: Obstetrics & Gynecology

## 2018-02-21 NOTE — Telephone Encounter (Signed)
Call placed to patient to scheduled recommended follow up ultrasound. Left voicemail message requesting a return call

## 2018-02-21 NOTE — Telephone Encounter (Signed)
That is fine.  Should I put in a order for PUS in two months to call pt then?

## 2018-02-21 NOTE — Telephone Encounter (Signed)
Patient returned call. Reviewed benefits for follow up ultrasound. Patient understood information presented and is agreeable. Patient advises she is recovering from surgery she had on 12/21/2017, and states "I would like to heal completely from this before moving on to something else." Patient request to wait "a couple of months" to scheduled ultrasound.   Forwarding to Dr Sabra Heck for review.

## 2018-02-25 NOTE — Telephone Encounter (Signed)
See previous phone note. Will defer ultrasound order for two month. Will follow up for scheduling in March 2020.  Forwarding to Dr Sabra Heck for final review. Will close encounter.

## 2018-03-21 ENCOUNTER — Ambulatory Visit
Admission: RE | Admit: 2018-03-21 | Discharge: 2018-03-21 | Disposition: A | Discharge status: Home / Self Care | Payer: Medicare HMO | Source: Ambulatory Visit | Attending: Family Medicine | Admitting: Family Medicine

## 2018-03-21 DIAGNOSIS — Z1231 Encounter for screening mammogram for malignant neoplasm of breast: Secondary | ICD-10-CM | POA: Diagnosis not present

## 2018-03-27 ENCOUNTER — Telehealth: Payer: Self-pay | Admitting: *Deleted

## 2018-03-27 NOTE — Telephone Encounter (Signed)
Patient calling to schedule ultrasound appointment.  °

## 2018-03-27 NOTE — Telephone Encounter (Signed)
Spoke with patient. Patient request to schedule PUS, expressed anxiety about transvaginal US and possible discomfort. Reviewed transvaginal US procedure with patient, questions answered. Reviewed option of transabdominal US, will need to arrive with full bladder, drink 32 oz water 1 hr prior to appt. Patient request to schedule as transabdominal US. PUS scheduled for 2/13 at 2:30pm, consult to follow at 3pm with Dr. Sabra Heck. Advised Dr. Sabra Heck will review our office will return call if ay additional recommendations. Patient agreeable.    PUS order previously placed. F/u of 2.4cm left ovary cyst noted on MR of pelvis 09/07/17.   Routing to provider for final review. Patient is agreeable to disposition. Will close encounter.  Cc: Lerry Liner

## 2018-04-04 ENCOUNTER — Ambulatory Visit (INDEPENDENT_AMBULATORY_CARE_PROVIDER_SITE_OTHER): Payer: Medicare HMO

## 2018-04-04 ENCOUNTER — Ambulatory Visit: Payer: Medicare HMO | Admitting: Obstetrics & Gynecology

## 2018-04-04 VITALS — BP 132/90 | HR 64 | Resp 16 | Ht 64.5 in | Wt 256.0 lb

## 2018-04-04 DIAGNOSIS — L988 Other specified disorders of the skin and subcutaneous tissue: Secondary | ICD-10-CM | POA: Diagnosis not present

## 2018-04-04 DIAGNOSIS — N83202 Unspecified ovarian cyst, left side: Secondary | ICD-10-CM

## 2018-04-04 NOTE — Progress Notes (Signed)
68 y.o. G29P1011 Divorced White or Caucasian female here for pelvic ultrasound due to recheck an ovarian cyst that was noted on the left ovary with a pelvic MRI obtained 09/07/2017.  This MRI was done due to a recurrent vulvar lesion that appeared to be a recurrent abscess or fistula.  This was not noted on the MRI.  However it ultimately was discovered to be a fistula that has been surgically treated by Dr. Marcello Moores.  The cyst on the ovary was an incidental finding measuring 2.4 cm it did appear benign.  Follow-up was recommended to ensure that this was stable and to visualize better with ultrasound.  Patient reports the area that has been opened by Dr. Marcello Moores had a seton placed has slowly healed.  There was a considerable amount of drainage for a long period time.  This has significantly diminished.  However she does not feel the area is fully healed and would like me to assess this today.  She does have follow-up plan with Dr. Marcello Moores.  Patient's last menstrual period was 02/20/2002 (approximate).  Contraception: Postmenopausal  Findings:  UTERUS: 7.4 x 4.2 x 4.5 cm EMS: 4.4 mm ADNEXA: Left ovary: 2.5 x 2.2 x 1.8 cm with a 1.6 x 1.4 cm simple appearing cyst.  This is avascular with smooth walls.       Right ovary: 1.2 x 1.7 x 1.1 cm CUL DE SAC: No free fluid  Discussion: Findings reviewed with patient.  Pictures reviewed.  As this is smaller in size and appears completely avascular, I do not think additional follow-up is necessary unless she has some new pelvic pain symptoms.  As well she had a change in physical exam, I would recommend repeat ultrasound that time.  Physical Exam  Constitutional: She appears well-developed and well-nourished.  Genitourinary:     Lymphadenopathy:       Right: No inguinal adenopathy present.       Left: No inguinal adenopathy present.   Physical exam findings reviewed.  As drainage has significantly improved, this is consistent with the findings on exam.  She  does have follow up plans with Dr. Leighton Ruff.  Assessment:  1.6cm simple left ovarian cyst, small than noted on MRI in 7/19 Anal fistula s/p ligation 12/21/17, still healing  Plan:  Feel do not need another PUS unless pt has changes in symptoms or physical exam findings. She is going to follow up with Dr. Marcello Moores as planned but may want another opinion if another procedure is recommended.  We discussed options and she will call if/when needs assistance.    ~30 minutes spent with patient >50% of time was in face to face discussion of above.

## 2018-04-10 ENCOUNTER — Encounter: Payer: Self-pay | Admitting: Obstetrics & Gynecology

## 2018-08-09 DIAGNOSIS — H5213 Myopia, bilateral: Secondary | ICD-10-CM | POA: Diagnosis not present

## 2018-08-09 DIAGNOSIS — Z961 Presence of intraocular lens: Secondary | ICD-10-CM | POA: Diagnosis not present

## 2018-08-09 DIAGNOSIS — H524 Presbyopia: Secondary | ICD-10-CM | POA: Diagnosis not present

## 2018-08-09 DIAGNOSIS — H04123 Dry eye syndrome of bilateral lacrimal glands: Secondary | ICD-10-CM | POA: Diagnosis not present

## 2018-08-15 DIAGNOSIS — Z0000 Encounter for general adult medical examination without abnormal findings: Secondary | ICD-10-CM | POA: Diagnosis not present

## 2018-08-15 DIAGNOSIS — N183 Chronic kidney disease, stage 3 (moderate): Secondary | ICD-10-CM | POA: Diagnosis not present

## 2018-08-15 DIAGNOSIS — E559 Vitamin D deficiency, unspecified: Secondary | ICD-10-CM | POA: Diagnosis not present

## 2018-08-15 DIAGNOSIS — I10 Essential (primary) hypertension: Secondary | ICD-10-CM | POA: Diagnosis not present

## 2018-08-15 DIAGNOSIS — M109 Gout, unspecified: Secondary | ICD-10-CM | POA: Diagnosis not present

## 2018-08-15 DIAGNOSIS — Z23 Encounter for immunization: Secondary | ICD-10-CM | POA: Diagnosis not present

## 2018-08-15 DIAGNOSIS — G250 Essential tremor: Secondary | ICD-10-CM | POA: Diagnosis not present

## 2018-08-15 DIAGNOSIS — E7800 Pure hypercholesterolemia, unspecified: Secondary | ICD-10-CM | POA: Diagnosis not present

## 2018-08-16 DIAGNOSIS — Z961 Presence of intraocular lens: Secondary | ICD-10-CM | POA: Diagnosis not present

## 2018-09-06 DIAGNOSIS — K603 Anal fistula: Secondary | ICD-10-CM | POA: Diagnosis not present

## 2018-09-10 DIAGNOSIS — K611 Rectal abscess: Secondary | ICD-10-CM | POA: Diagnosis not present

## 2018-11-01 DIAGNOSIS — Z23 Encounter for immunization: Secondary | ICD-10-CM | POA: Diagnosis not present

## 2018-11-07 DIAGNOSIS — R69 Illness, unspecified: Secondary | ICD-10-CM | POA: Diagnosis not present

## 2018-11-28 ENCOUNTER — Ambulatory Visit: Payer: Medicare HMO | Admitting: Obstetrics & Gynecology

## 2019-02-27 ENCOUNTER — Other Ambulatory Visit: Payer: Self-pay | Admitting: Family Medicine

## 2019-02-27 DIAGNOSIS — Z1231 Encounter for screening mammogram for malignant neoplasm of breast: Secondary | ICD-10-CM

## 2019-03-26 ENCOUNTER — Other Ambulatory Visit: Payer: Self-pay

## 2019-03-28 ENCOUNTER — Other Ambulatory Visit: Payer: Self-pay

## 2019-03-28 ENCOUNTER — Ambulatory Visit (INDEPENDENT_AMBULATORY_CARE_PROVIDER_SITE_OTHER): Payer: Medicare HMO | Admitting: Obstetrics & Gynecology

## 2019-03-28 ENCOUNTER — Encounter: Payer: Self-pay | Admitting: Obstetrics & Gynecology

## 2019-03-28 VITALS — BP 118/80 | HR 68 | Temp 97.2°F | Resp 12 | Ht 64.5 in | Wt 259.0 lb

## 2019-03-28 DIAGNOSIS — Z01411 Encounter for gynecological examination (general) (routine) with abnormal findings: Secondary | ICD-10-CM

## 2019-03-28 NOTE — Progress Notes (Signed)
69 y.o. G41P1011 Divorced White or Caucasian female here for annual exam.  Has perianal fistula that was surgically treated in 2019 by Dr. Marcello Moores.  Did have follow up this summer.  Has decided not to do anything else done at this point.  Dr. Marcello Moores continues to follow her.    Getting the Covid vaccine today.    Denies vaginal bleeding.   Patient's last menstrual period was 02/20/2002 (approximate).          Sexually active: No.  The current method of family planning is post menopausal status.    Exercising: No.  The patient does not participate in regular exercise at present. Smoker:  no  Health Maintenance: Pap:  08/14/17 Neg   04/02/14 Neg History of abnormal Pap:  no MMG:  03/21/18 BIRADS 1 negative/density a -- Scheduled 04/18/19  Colonoscopy:  08/02/17.   BMD:   05/10/15 Normal TDaP:  UTD Pneumonia vaccine(s):  completed Shingrix:   Zostavax done Hep C testing: never Screening Labs: PCP   reports that she has never smoked. She has never used smokeless tobacco. She reports that she does not drink alcohol or use drugs.  Past Medical History:  Diagnosis Date  . Anal fistula   . Anxiety   . Balance disorder    due to right ear nerve damage (acoustic neuroma)  . Chronic kidney disease (CKD), stage III (moderate)    FOLLOWED BY PCP  . Deafness in right ear    secondary to right side acoustic neuroma  s/p  resection 07/ 2010  . Generalized weakness    LEGS  . GERD (gastroesophageal reflux disease)    RARE  . Gout    09-24-2017  STABLE,  LAST EPISODE 2018  . Hyperlipidemia   . Hypertension   . IBS (irritable bowel syndrome)   . Left ovarian cyst    per MRI 09-07-2017  . Personal history of adenomatous colonic polyps   . Right acoustic neuroma [facility]) 08/2008   08-20-2008   s/p  right retromastoid suboccipital craniectomy and excision right cerebellopontine angle tumor meatus  . Seasonal allergies   . Tremors of nervous system    FAMILIAL  . Trigeminal neuralgia of left side  of face   . Urinary incontinence     Past Surgical History:  Procedure Laterality Date  . CATARACT EXTRACTION W/ INTRAOCULAR LENS  IMPLANT, BILATERAL  06-26-2017;  07-03-2017  . [place]  . COLONOSCOPY WITH PROPOFOL  last one 08-02-2017  . EYE SURGERY Bilateral 2019   CATARACT SURGERY BOTH EYES  . FISTULOTOMY N/A 09/27/2017   Procedure: SETON PLACEMENT;  Surgeon: Leighton Ruff, MD;  Location: [facility];  Service: General;  Laterality: N/A;  . LIGATION OF INTERNAL FISTULA TRACT N/A 12/21/2017   Procedure: LIGATION OF INTERNAL FISTULA TRACT;  Surgeon: Leighton Ruff, MD;  Location: [place];  Service: General;  Laterality: N/A;  . RIGHT SUBOCCIPITAL CRANIECTOMY GROSS TOTAL RESECTION OF TUMOR WITH DRILLING OF THE INTERNAL ACOUSTIC MEATUS AND EXCISION RIGHT CEREBELLPONTINE ANLE TUMOR, MICRODISSECTION , MEATUS  08-20-2008    dr elsner and dr Benjamine Mola   [facility]   right acoustic neuroma  . WISDOM TOOTH EXTRACTION      Current Outpatient Medications  Medication Sig Dispense Refill  . allopurinol (ZYLOPRIM) 300 MG tablet Take 300 mg by mouth every evening.   0  . aspirin 81 MG tablet Take 81 mg by mouth daily.    . Calcium-Phosphorus-Vitamin D (CALCIUM GUMMIES PO) Take  by mouth.    . clonazePAM (KLONOPIN) 0.5 MG tablet Take 0.5 mg by mouth 2 (two) times daily as needed for anxiety.    . docusate sodium (COLACE) 100 MG capsule Take 100 mg by mouth 2 (two) times daily.    . furosemide (LASIX) 20 MG tablet Take 20 mg by mouth 2 (two) times daily as needed. Pt uses this prn only     . irbesartan (AVAPRO) 150 MG tablet TAKE 1/2 TO 1 TABLET BY MOUTH ONCE A DAY---- PER PT TAKES 1/2 TABLET IN AM  4  . polyethylene glycol powder (MIRALAX) powder Take 1 Container by mouth daily as needed. 238 grams for colon 6-13     . propranolol (INDERAL) 40 MG tablet TAKE 1 TABLET BY MOUTH ON AN EMPTY STOMACH TWICE A DAY X 30 DAYS----  TAKES TWICE DAILY  (takes essential tremors)   0  . simvastatin (ZOCOR) 20 MG tablet Take 20 mg by mouth at bedtime.     . Vitamin D, Ergocalciferol, (DRISDOL) 50000 UNITS CAPS Take 50,000 Units by mouth every 7 (seven) days. TAKES ON FRIDAYS    . Wheat Dextrin (BENEFIBER PO) Take by mouth.    . oxyCODONE (OXY IR/ROXICODONE) 5 MG immediate release tablet Take 1 tablet (5 mg total) by mouth every 4 (four) hours as needed. (Patient not taking: Reported on 03/28/2019) 30 tablet 0   No current facility-administered medications for this visit.    Family History  Problem Relation Age of Onset  . Diabetes Mother   . Heart disease Mother   . Hypertension Mother   . Prostate cancer Father        also non hodgkins lymphoma  . Non-Hodgkin's lymphoma Father   . Colon cancer Paternal Uncle   . Testicular cancer Brother   . Prostate cancer Brother        hx a fib  . Breast cancer Sister   . Heart disease Sister   . Colon polyps Sister   . Breast cancer Other        niece  . Rectal cancer Neg Hx   . Stomach cancer Neg Hx     Review of Systems  All other systems reviewed and are negative.   Exam:   BP 118/80 (BP Location: Left Arm, Patient Position: Sitting, Cuff Size: Large)   Pulse 68   Temp (!) 97.2 F (36.2 C) (Temporal)   Resp 12   Ht 5' 4.5" (1.638 m)   Wt 259 lb (117.5 kg)   LMP 02/20/2002 (Approximate)   BMI 43.77 kg/m   Height: 5' 4.5" (163.8 cm)  Ht Readings from Last 3 Encounters:  03/28/19 5' 4.5" (1.638 m)  04/04/18 5' 4.5" (1.638 m)  12/21/17 5' 4.5" (1.638 m)    General appearance: alert, cooperative and appears stated age Head: Normocephalic, without obvious abnormality, atraumatic Neck: no adenopathy, supple, symmetrical, trachea midline and thyroid normal to inspection and palpation Lungs: clear to auscultation bilaterally Breasts: normal appearance, no masses or tenderness Heart: regular rate and rhythm Abdomen: soft, non-tender; bowel sounds normal; no masses,  no organomegaly Extremities: extremities  normal, atraumatic, no cyanosis or edema Skin: Skin color, texture, turgor normal. No rashes or lesions Lymph nodes: Cervical, supraclavicular, and axillary nodes normal. No abnormal inguinal nodes palpated Neurologic: Grossly normal   Pelvic: External genitalia:  no lesions              Urethra:  normal appearing urethra with no masses, tenderness or lesions  Bartholins and Skenes: normal                 Vagina: normal appearing vagina with normal color and discharge, small erythematous lesion just left of vagina, low on right without drainage noted today              Cervix: no lesions              Pap taken: No. Bimanual Exam:  Uterus:  normal size, contour, position, consistency, mobility, non-tender              Adnexa: normal adnexa and no mass, fullness, tenderness               Rectovaginal: Confirms               Anus:  normal sphincter tone, no lesions  Chaperone, Terence Lux, CMA, was present for exam.  A:  Well Woman with normal exam PMP, no HRT H/o 1.6cm simple left ovarian cyst followed with repeat 03/2018.  Decreased in size H/o colon polyps Anal fistula, s/p seton placement 9/19, then ligation of internal fistula trace 11/19 (pt still has symptoms and has been seen in follow up) H/o right acoustic neuroma, no hearing on this side Hypertension Elevated lipids Obeisty  P:   Mammogram guidelines reviewed.  Pt has appt scheduled.  pap smear neg 6/19.  Not indicated today. Colonoscopy 2019 BMD normal 3/17 Vaccines reviewed Return annually or prn

## 2019-04-08 ENCOUNTER — Ambulatory Visit: Payer: Medicare HMO

## 2019-04-08 ENCOUNTER — Telehealth: Payer: Self-pay | Admitting: Obstetrics & Gynecology

## 2019-04-08 NOTE — Telephone Encounter (Signed)
Spoke with patient. Patient received first Covid19 injection on 2/5, reports sore arm, no other symptoms. Due for 2nd injection 2/26. Patient asking of she should reschedule MMG that is scheduled for today?   Dr. Sabra Heck -please advise.

## 2019-04-08 NOTE — Telephone Encounter (Signed)
Spoke with patient, advised per Dr. Sabra Heck. Patient states she is unsure and may want to reschedule. Advised patient that is ultimately her decision. Patient verbalizes understanding and is agreeable.   Routing to provider for final review. Patient is agreeable to disposition. Will close encounter.

## 2019-04-08 NOTE — Telephone Encounter (Signed)
Yes she should get her mammogram today.  There is no interaction between the vaccine and mammography.

## 2019-04-08 NOTE — Telephone Encounter (Signed)
Patient is scheduled for mammogram today but heard on the news it's not good to do if you are in the process of getting the covid vaccine.

## 2019-06-03 ENCOUNTER — Ambulatory Visit
Admission: RE | Admit: 2019-06-03 | Discharge: 2019-06-03 | Disposition: A | Discharge status: Home / Self Care | Payer: Medicare HMO | Source: Ambulatory Visit | Attending: Family Medicine | Admitting: Family Medicine

## 2019-06-03 ENCOUNTER — Other Ambulatory Visit: Payer: Self-pay

## 2019-06-03 DIAGNOSIS — Z1231 Encounter for screening mammogram for malignant neoplasm of breast: Secondary | ICD-10-CM | POA: Diagnosis not present

## 2019-09-01 DIAGNOSIS — Z0000 Encounter for general adult medical examination without abnormal findings: Secondary | ICD-10-CM | POA: Diagnosis not present

## 2019-09-01 DIAGNOSIS — E559 Vitamin D deficiency, unspecified: Secondary | ICD-10-CM | POA: Diagnosis not present

## 2019-09-01 DIAGNOSIS — I10 Essential (primary) hypertension: Secondary | ICD-10-CM | POA: Diagnosis not present

## 2019-09-01 DIAGNOSIS — R2689 Other abnormalities of gait and mobility: Secondary | ICD-10-CM | POA: Diagnosis not present

## 2019-09-01 DIAGNOSIS — N183 Chronic kidney disease, stage 3 unspecified: Secondary | ICD-10-CM | POA: Diagnosis not present

## 2019-09-01 DIAGNOSIS — M109 Gout, unspecified: Secondary | ICD-10-CM | POA: Diagnosis not present

## 2019-09-01 DIAGNOSIS — R69 Illness, unspecified: Secondary | ICD-10-CM | POA: Diagnosis not present

## 2019-09-01 DIAGNOSIS — E7800 Pure hypercholesterolemia, unspecified: Secondary | ICD-10-CM | POA: Diagnosis not present

## 2019-09-07 IMAGING — MR MR PELVIS WO/W CM
7 of 12 series · 24 of 48 positions shown · IV contrast (20ml Multihance)
Comparison: None.

CLINICAL DATA: Recurrent vulvar bleeding with ulcerated lesion in
left labia.

Creatinine was obtained on site at [HOSPITAL] at [HOSPITAL].
Results: Creatinine 0.9 mg/dL.
EXAM:
MRI PELVIS WITHOUT AND WITH CONTRAST
TECHNIQUE: Multiplanar multisequence MR imaging of the pelvis was performed
both before and after administration of intravenous contrast.
CONTRAST:  20mL MULTIHANCE GADOBENATE DIMEGLUMINE 529 MG/ML IV SOLN

[Series 3: T2 · coronal · 5.0mm · 0.82mm/px · 4 of 40 slices shown]
[im 1/40]
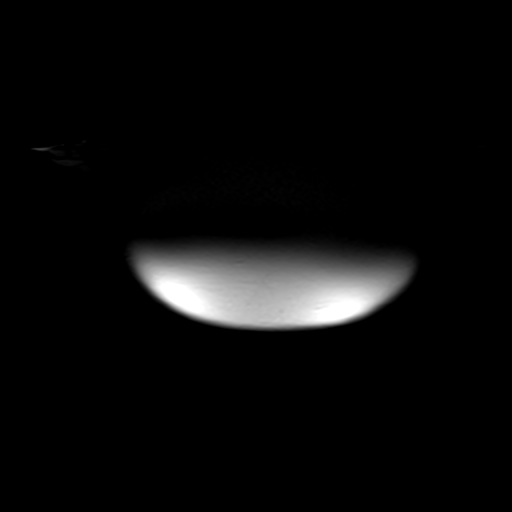
[im 14/40]
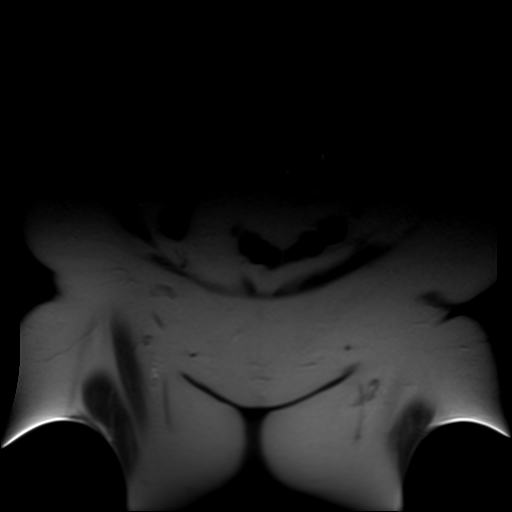
[im 27/40]
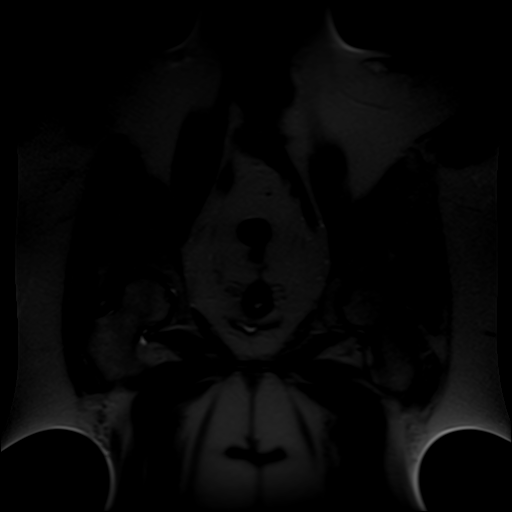
[im 40/40]
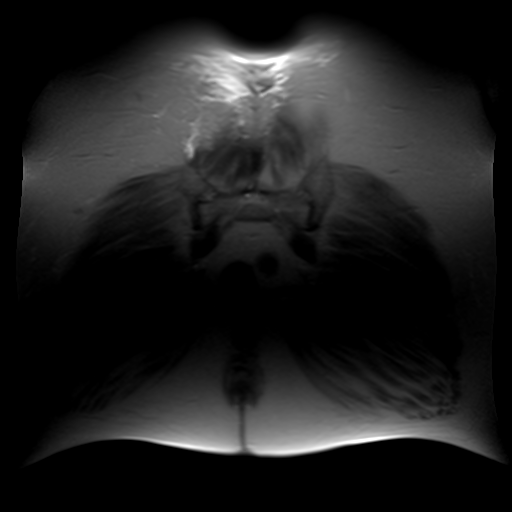

[Series 4: t2_tse_sag · sagittal · 5.0mm · 0.70mm/px · 3 of 33 slices shown]
[im 1/33]
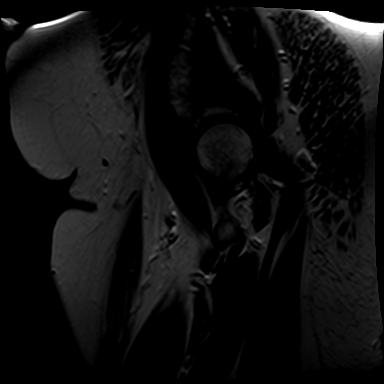
[im 17/33]
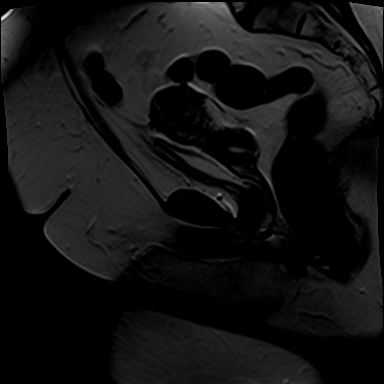
[im 33/33]
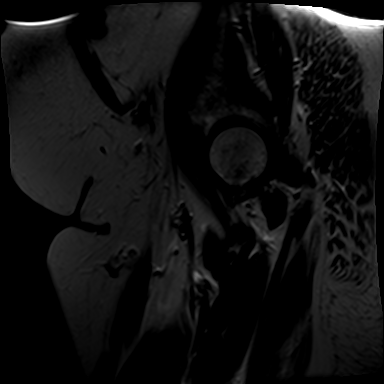

[Series 5: t2_tse axial fs · axial · 5.0mm · 0.51mm/px · z∈[-37,+156]mm · 4 of 32 slices shown]
[im 1/32]
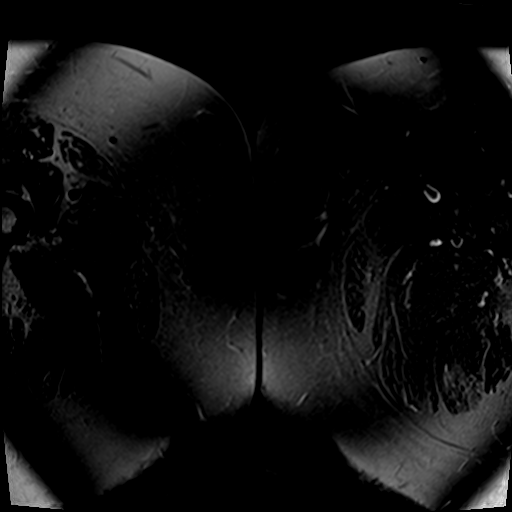
[im 11/32]
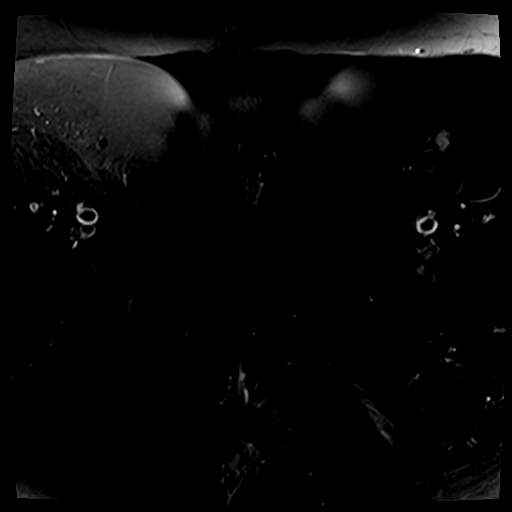
[im 21/32]
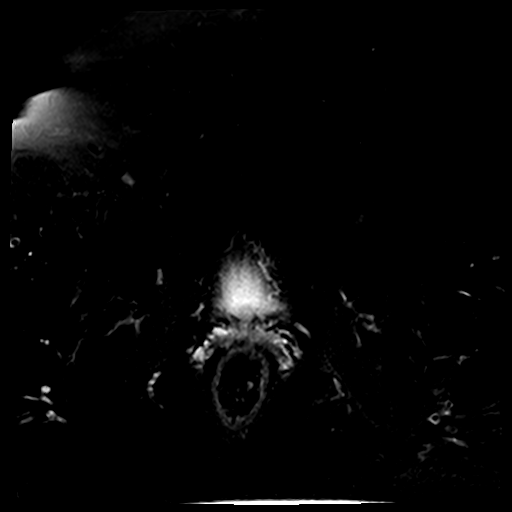
[im 32/32]
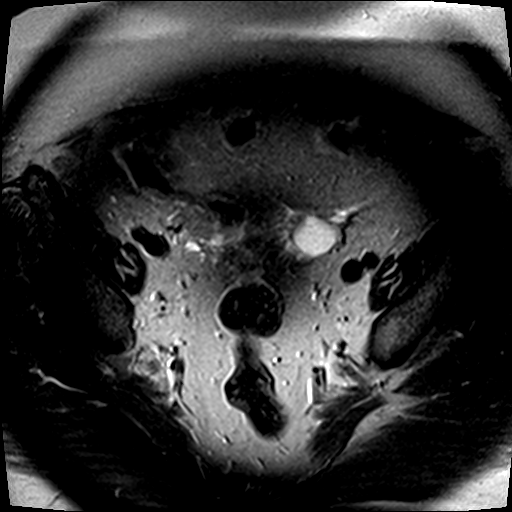

[Series 6: t2_tse axial · axial · 5.0mm · 0.53mm/px · z∈[-37,+156]mm · 4 of 32 slices shown]
[im 1/32]
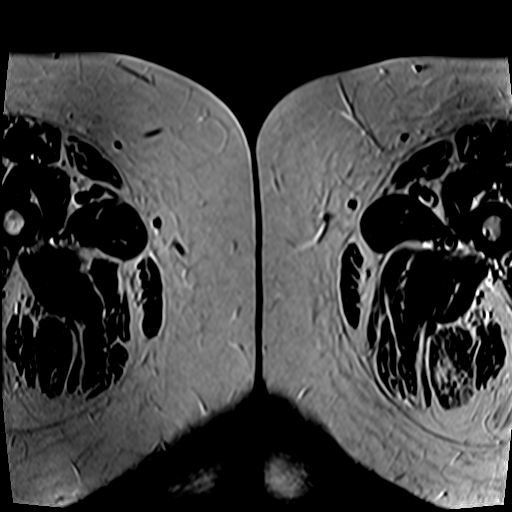
[im 11/32]
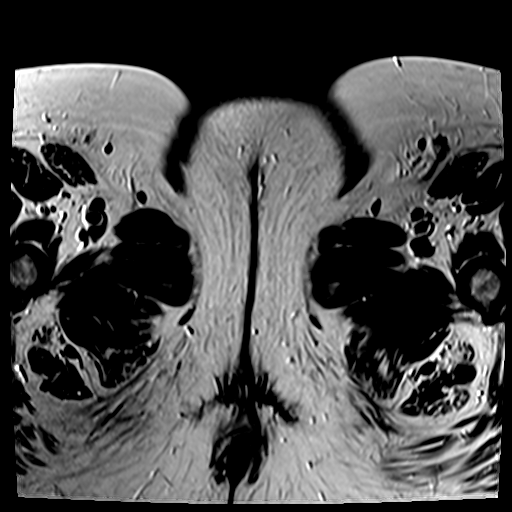
[im 21/32]
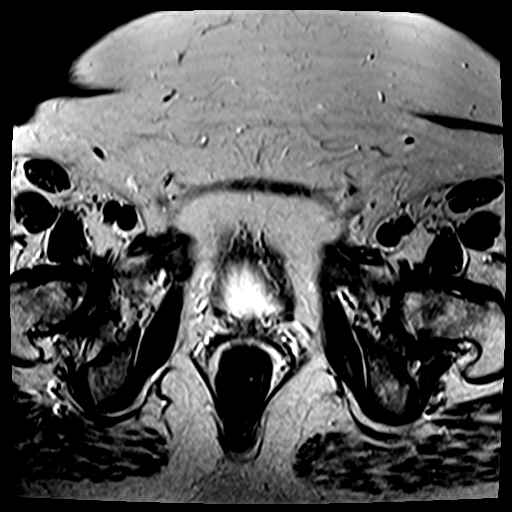
[im 32/32]
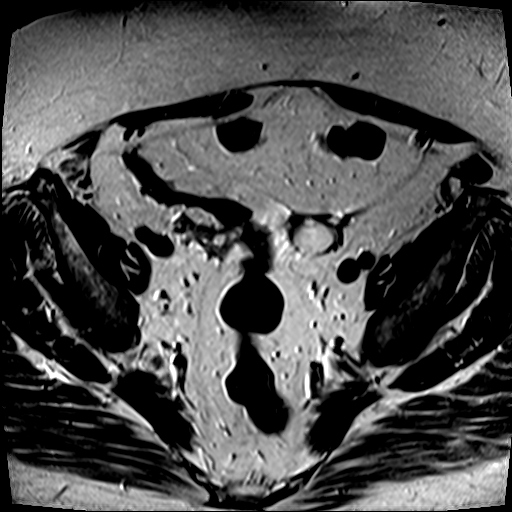

[Series 7: axial spgr · axial · 5.0mm · 0.51mm/px · z∈[-37,+156]mm · 4 of 32 slices shown]
[im 1/32]
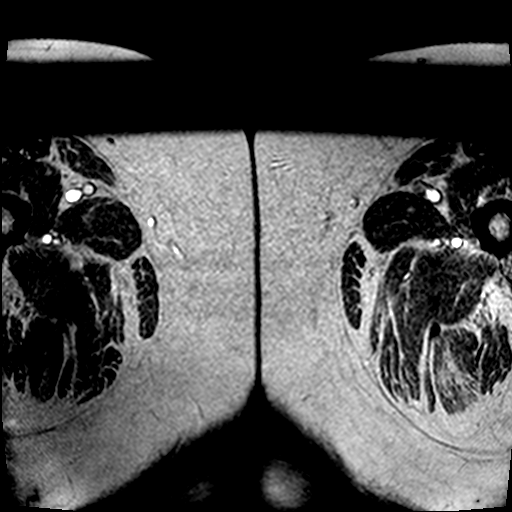
[im 11/32]
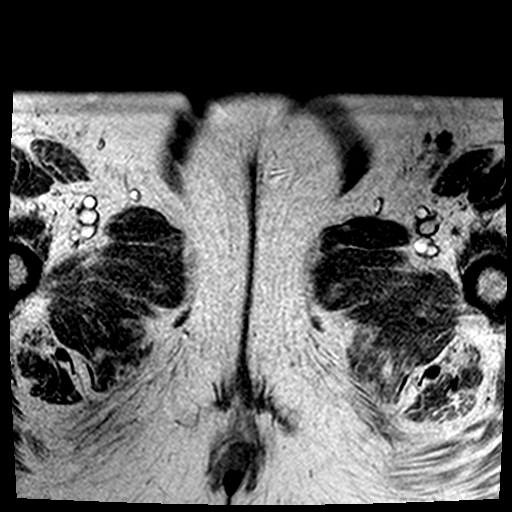
[im 21/32]
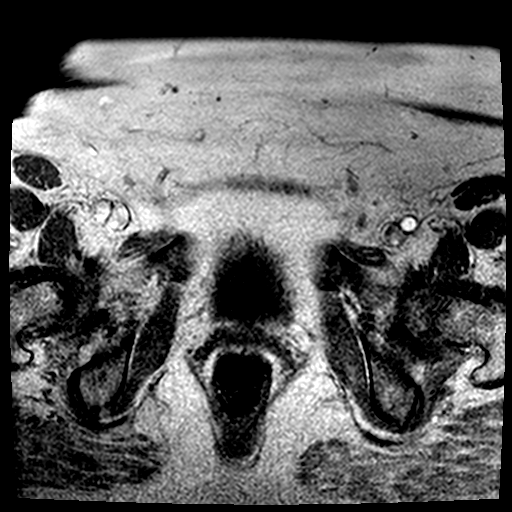
[im 32/32]
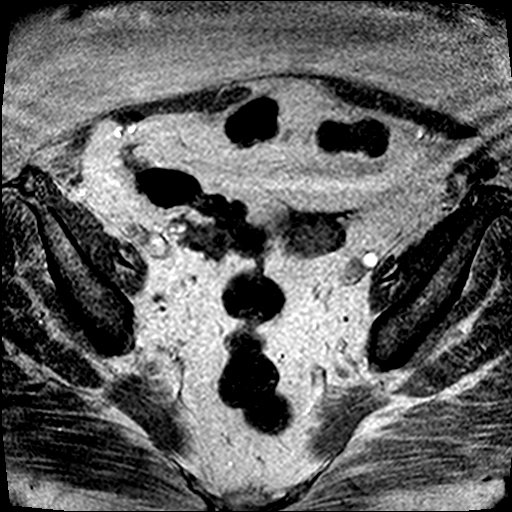

[Series 8: axial spgr fs · axial · non-contrast · 5.0mm · 1.02mm/px · z∈[-37,+156]mm · 4 of 32 slices shown]
[im 1/32]
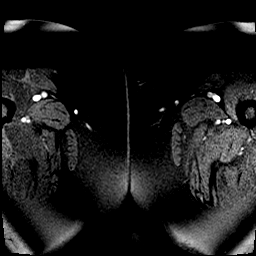
[im 11/32]
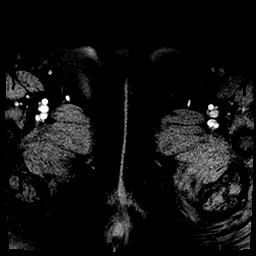
[im 21/32]
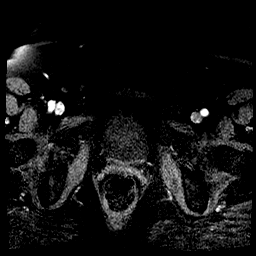
[im 32/32]
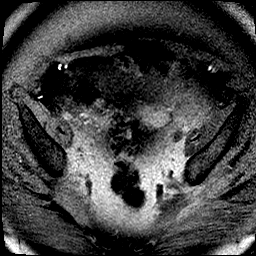

[Series 9: axial spgr post · axial · 5.0mm · 1.02mm/px · 1 of 32 slices shown]
[im 1/32]
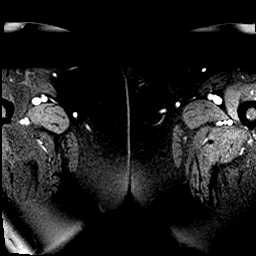

[24 of 48 positions shown; findings below may reference images not displayed]

FINDINGS: Urinary Tract: No urinary bladder or urethral abnormality.

Bowel: Unremarkable visualized pelvic bowel loops.

Vascular/Lymphatic: Unremarkable. No pathologically enlarged pelvic
lymph nodes identified.

Reproductive:

-- Uterus: Measures 8.8 x 3.8 x 4.6 cm (volume = 81 cm^3). No
fibroids or other masses identified. Thin endometrium. Cervix and
vagina are unremarkable.

-- Right ovary: Appears normal. No mass or inflammatory process
identified.

-- Left ovary: 2.4 cm benign-appearing left ovarian cyst. No complex
cystic or solid masses identified.

Other: No evidence of mass or abnormal fluid collections within the
labia.

Musculoskeletal:  Unremarkable.
IMPRESSION: No labial mass or abscess identified.

2.4 cm benign-appearing left ovarian cyst. Followup by pelvic
ultrasound is recommended in a postmenopausal female in 6-12 months.

## 2019-09-16 DIAGNOSIS — R531 Weakness: Secondary | ICD-10-CM | POA: Diagnosis not present

## 2019-09-16 DIAGNOSIS — R2689 Other abnormalities of gait and mobility: Secondary | ICD-10-CM | POA: Diagnosis not present

## 2019-09-18 DIAGNOSIS — R531 Weakness: Secondary | ICD-10-CM | POA: Diagnosis not present

## 2019-09-18 DIAGNOSIS — R2689 Other abnormalities of gait and mobility: Secondary | ICD-10-CM | POA: Diagnosis not present

## 2019-09-30 DIAGNOSIS — R531 Weakness: Secondary | ICD-10-CM | POA: Diagnosis not present

## 2019-09-30 DIAGNOSIS — R2689 Other abnormalities of gait and mobility: Secondary | ICD-10-CM | POA: Diagnosis not present

## 2019-10-07 DIAGNOSIS — R2689 Other abnormalities of gait and mobility: Secondary | ICD-10-CM | POA: Diagnosis not present

## 2019-10-07 DIAGNOSIS — R531 Weakness: Secondary | ICD-10-CM | POA: Diagnosis not present

## 2019-10-14 DIAGNOSIS — R531 Weakness: Secondary | ICD-10-CM | POA: Diagnosis not present

## 2019-10-14 DIAGNOSIS — R2689 Other abnormalities of gait and mobility: Secondary | ICD-10-CM | POA: Diagnosis not present

## 2019-10-21 DIAGNOSIS — R531 Weakness: Secondary | ICD-10-CM | POA: Diagnosis not present

## 2019-10-21 DIAGNOSIS — R2689 Other abnormalities of gait and mobility: Secondary | ICD-10-CM | POA: Diagnosis not present

## 2019-10-31 DIAGNOSIS — R69 Illness, unspecified: Secondary | ICD-10-CM | POA: Diagnosis not present

## 2020-03-02 ENCOUNTER — Other Ambulatory Visit: Payer: Self-pay | Admitting: Family Medicine

## 2020-03-02 DIAGNOSIS — Z1231 Encounter for screening mammogram for malignant neoplasm of breast: Secondary | ICD-10-CM

## 2020-06-03 ENCOUNTER — Ambulatory Visit: Payer: Medicare HMO

## 2020-06-04 ENCOUNTER — Other Ambulatory Visit: Payer: Self-pay

## 2020-06-04 ENCOUNTER — Ambulatory Visit
Admission: RE | Admit: 2020-06-04 | Discharge: 2020-06-04 | Disposition: A | Discharge status: Home / Self Care | Payer: Medicare HMO | Source: Ambulatory Visit | Attending: Family Medicine | Admitting: Family Medicine

## 2020-06-04 DIAGNOSIS — Z1231 Encounter for screening mammogram for malignant neoplasm of breast: Secondary | ICD-10-CM

## 2020-06-25 ENCOUNTER — Ambulatory Visit: Payer: Medicare HMO

## 2020-08-26 ENCOUNTER — Telehealth (HOSPITAL_BASED_OUTPATIENT_CLINIC_OR_DEPARTMENT_OTHER): Payer: Self-pay | Admitting: *Deleted

## 2020-08-26 NOTE — Telephone Encounter (Signed)
Pt called stating that she has been having some bleeding on and off coming from the perineal area. She describes the bleeding as spotting at times and like a nosebleed at other times.  Pt states that this has happened before and she has had fistula surgery in the past. She states that she feels that it could be coming from her rectocele as she notices it more after she has been sitting for a while or after she has a bowel movement. Pt given an appt for next Tuesday for evaluation by Dr. Sabra Heck. Advised that if Dr. Sabra Heck thought she should be seen today then I would call her back and get her in today. Pt verbalized understanding.

## 2020-08-31 ENCOUNTER — Ambulatory Visit (INDEPENDENT_AMBULATORY_CARE_PROVIDER_SITE_OTHER): Payer: Medicare HMO | Admitting: Obstetrics & Gynecology

## 2020-08-31 ENCOUNTER — Other Ambulatory Visit: Payer: Self-pay

## 2020-08-31 ENCOUNTER — Encounter (HOSPITAL_BASED_OUTPATIENT_CLINIC_OR_DEPARTMENT_OTHER): Payer: Self-pay | Admitting: Obstetrics & Gynecology

## 2020-08-31 ENCOUNTER — Other Ambulatory Visit (HOSPITAL_COMMUNITY)
Admission: RE | Admit: 2020-08-31 | Discharge: 2020-08-31 | Disposition: A | Discharge status: Home / Self Care | Payer: Medicare HMO | Source: Ambulatory Visit | Attending: Obstetrics & Gynecology | Admitting: Obstetrics & Gynecology

## 2020-08-31 VITALS — BP 161/95 | HR 70 | Wt 263.0 lb

## 2020-08-31 DIAGNOSIS — K603 Anal fistula: Secondary | ICD-10-CM

## 2020-08-31 DIAGNOSIS — Z124 Encounter for screening for malignant neoplasm of cervix: Secondary | ICD-10-CM

## 2020-08-31 DIAGNOSIS — N816 Rectocele: Secondary | ICD-10-CM | POA: Diagnosis not present

## 2020-08-31 NOTE — Progress Notes (Signed)
GYNECOLOGY  VISIT  CC:   bleeding  HPI: 70 y.o. G71P1011 Divorced White or Caucasian female here for recheck of bleeding area on vulva that has been diagnosed as an anal fistula.  She is not sure this is what is bleeding, exactly, so wanted to come in for exam today.  Has undergone treatment of fistula with a seton placement and then later ligation of fistula.  Pt felt like area recurred very quickly after last surgery which was 12/21/2017.  She has not seen Dr. Marcello Moores recently.  Denies pain or changes with bowel movement.  Pt reports seeing the most blood, which is bright red, when she sits in her recliner for longer periods of time.  This is why she is not sure if the bleeding is from prior fistula area.  Denies urinary symptoms as well.  Pt does have a rectocele and wonders if this is related and/or causing the bleeding.  Is not having any specific problem but would like breast exam at well.  Marland Kitchen  GYNECOLOGIC HISTORY: Patient's last menstrual period was 02/20/2002 (approximate). Contraception: PMP Menopausal hormone therapy: no HRT  Patient Active Problem List   Diagnosis Date Noted   Anal fissure 08/02/2017   Rectocele 08/02/2017   Personal history of adenomatous colonic polyps 11/21/2011    Past Medical History:  Diagnosis Date   Anal fistula    Anxiety    Balance disorder    due to right ear nerve damage (acoustic neuroma)   Chronic kidney disease (CKD), stage III (moderate) (HCC)    FOLLOWED BY PCP   Deafness in right ear    secondary to right side acoustic neuroma  s/p  resection 07/ 2010   Generalized weakness    LEGS   GERD (gastroesophageal reflux disease)    RARE   Gout    09-24-2017  STABLE,  LAST EPISODE 2018   Hyperlipidemia    Hypertension    IBS (irritable bowel syndrome)    Left ovarian cyst    per MRI 09-07-2017   Personal history of adenomatous colonic polyps    Right acoustic neuroma ([place]) 08/2008   08-20-2008   s/p  right retromastoid suboccipital  craniectomy and excision right cerebellopontine angle tumor meatus   Seasonal allergies    Tremors of nervous system    FAMILIAL   Trigeminal neuralgia of left side of face    Urinary incontinence     Past Surgical History:  Procedure Laterality Date   CATARACT EXTRACTION W/ INTRAOCULAR LENS  IMPLANT, BILATERAL  06-26-2017;  07-03-2017   CESAREAN SECTION  1978   COLONOSCOPY WITH PROPOFOL  last one 08-02-2017   EYE SURGERY Bilateral 2019   CATARACT SURGERY BOTH EYES   FISTULOTOMY N/A 09/27/2017   Procedure: SETON PLACEMENT;  Surgeon: Leighton Ruff, MD;  Location: [place];  Service: General;  Laterality: N/A;   LIGATION OF INTERNAL FISTULA TRACT N/A 12/21/2017   Procedure: LIGATION OF INTERNAL FISTULA TRACT;  Surgeon: Leighton Ruff, MD;  Location: [place];  Service: General;  Laterality: N/A;   RIGHT SUBOCCIPITAL CRANIECTOMY GROSS TOTAL RESECTION OF TUMOR WITH DRILLING OF THE INTERNAL ACOUSTIC MEATUS AND EXCISION RIGHT [place] TUMOR, [place] , MEATUS  08-20-2008    dr elsner and dr Benjamine Mola   [facility]   right acoustic neuroma   WISDOM TOOTH EXTRACTION      MEDS:   Current Outpatient Medications on File Prior to Visit  Medication Sig Dispense Refill   allopurinol (ZYLOPRIM) 300 MG  tablet Take 300 mg by mouth every evening.   0   aspirin 81 MG tablet Take 81 mg by mouth daily.     clonazePAM (KLONOPIN) 0.5 MG tablet Take 0.5 mg by mouth 2 (two) times daily as needed for anxiety.     furosemide (LASIX) 20 MG tablet Take 20 mg by mouth 2 (two) times daily as needed. Pt uses this prn only      irbesartan (AVAPRO) 150 MG tablet TAKE 1/2 TO 1 TABLET BY MOUTH ONCE A DAY---- PER PT TAKES 1/2 TABLET IN AM  4   polyethylene glycol powder (GLYCOLAX/MIRALAX) 17 GM/SCOOP powder Take 1 Container by mouth daily as needed. 238 grams for colon 6-13      propranolol (INDERAL) 40 MG tablet TAKE 1 TABLET BY MOUTH ON AN EMPTY STOMACH TWICE A DAY X 30  DAYS----  TAKES TWICE DAILY  (takes essential tremors)  0   simvastatin (ZOCOR) 20 MG tablet Take 20 mg by mouth at bedtime.      Vitamin D, Ergocalciferol, (DRISDOL) 50000 UNITS CAPS Take 50,000 Units by mouth every 7 (seven) days. TAKES ON FRIDAYS     Wheat Dextrin (BENEFIBER PO) Take by mouth.     oxyCODONE (OXY IR/ROXICODONE) 5 MG immediate release tablet Take 1 tablet (5 mg total) by mouth every 4 (four) hours as needed. (Patient not taking: No sig reported) 30 tablet 0   No current facility-administered medications on file prior to visit.    ALLERGIES: Shellfish allergy, Adhesive [tape], and Other  Family History  Problem Relation Age of Onset   Diabetes Mother    Heart disease Mother    Hypertension Mother    Prostate cancer Father        also non hodgkins lymphoma   Non-Hodgkin's lymphoma Father    Colon cancer Paternal Uncle    Testicular cancer Brother    Prostate cancer Brother        hx a fib   Breast cancer Sister    Heart disease Sister    Colon polyps Sister    Breast cancer Other        niece   Rectal cancer Neg Hx    Stomach cancer Neg Hx    SH:  divorced, non smoker  Review of Systems  Genitourinary:  Positive for vaginal bleeding ((pt unsure)).  All other systems reviewed and are negative.  PHYSICAL EXAMINATION:    BP (!) 161/95   Pulse 70   Wt 263 lb (119.3 kg)   LMP 02/20/2002 (Approximate)   BMI 44.45 kg/m     General appearance: alert, cooperative and appears stated age Breasts: normal appearance, no masses or tenderness Abdomen: soft, non-tender; bowel sounds normal; no masses,  no organomegaly Lymph:  no inguinal LAD noted  Pelvic: External genitalia:  soft, fluctuant, non tender lesion on left lower labia major, no discrete mass; also present just to left of vaginal at perineal body is a small opening with granulation tissue, bleeds easily with touching              Urethra:  normal appearing urethra with no masses, tenderness or lesions               Bartholins and Skenes: normal                 Vagina: normal appearing vagina with normal color and discharge, no lesions              Cervix: no lesions  and no blood present              Bimanual Exam:  Uterus:  normal size, contour, position, consistency, mobility, non-tender              Adnexa: no mass, fullness, tenderness              Rectovaginal: Yes.  .  Confirms.              Anus:  normal sphincter tone, no lesions  Chaperone, Octaviano Batty, CMA, was present for exam.  Assessment/Plan: 1. Perianal fistula - will reach out to Dr. Marcello Moores regarding pt and see if needs any imaging prior to appt  2. Rectocele - stable in size. Reassured pt this was not causing bleeding.  3. Cervical cancer screening - Cytology - PAP( [facility]) - PR OBTAINING SCREEN PAP SMEAR

## 2020-09-06 LAB — CYTOLOGY - PAP
Adequacy: ABSENT
Diagnosis: NEGATIVE

## 2020-09-14 DIAGNOSIS — I10 Essential (primary) hypertension: Secondary | ICD-10-CM | POA: Diagnosis not present

## 2020-09-14 DIAGNOSIS — M109 Gout, unspecified: Secondary | ICD-10-CM | POA: Diagnosis not present

## 2020-09-14 DIAGNOSIS — E7800 Pure hypercholesterolemia, unspecified: Secondary | ICD-10-CM | POA: Diagnosis not present

## 2020-09-14 DIAGNOSIS — N183 Chronic kidney disease, stage 3 unspecified: Secondary | ICD-10-CM | POA: Diagnosis not present

## 2020-09-14 DIAGNOSIS — E559 Vitamin D deficiency, unspecified: Secondary | ICD-10-CM | POA: Diagnosis not present

## 2020-09-14 DIAGNOSIS — Z1159 Encounter for screening for other viral diseases: Secondary | ICD-10-CM | POA: Diagnosis not present

## 2020-09-14 DIAGNOSIS — G250 Essential tremor: Secondary | ICD-10-CM | POA: Diagnosis not present

## 2020-09-14 DIAGNOSIS — Z0000 Encounter for general adult medical examination without abnormal findings: Secondary | ICD-10-CM | POA: Diagnosis not present

## 2020-10-22 ENCOUNTER — Telehealth (HOSPITAL_BASED_OUTPATIENT_CLINIC_OR_DEPARTMENT_OTHER): Payer: Self-pay | Admitting: Obstetrics & Gynecology

## 2020-10-22 NOTE — Telephone Encounter (Signed)
Patient called today and want to follow up with Dr.Miller about talking to Dr.Thomas about her bleeding.

## 2020-10-26 NOTE — Telephone Encounter (Signed)
DOB verified. Informed pt that when Dr. Sabra Heck saw her last, the area of concern the patient was referring to, that had been repaired by Dr. Marcello Moores had not fully healed. Informed that Dr. Sabra Heck had reached out to Dr. Marcello Moores' office about this. Pt states that she had not received a call from them. I offered our apologizes. Advised that since she is an established pt with Dr. Marcello Moores, she does not need a referral and can call their office directly to request an appt. Pt verbalized understanding.

## 2020-10-29 ENCOUNTER — Ambulatory Visit (HOSPITAL_BASED_OUTPATIENT_CLINIC_OR_DEPARTMENT_OTHER): Payer: Medicare HMO | Admitting: Obstetrics & Gynecology

## 2020-11-23 DIAGNOSIS — Z20822 Contact with and (suspected) exposure to covid-19: Secondary | ICD-10-CM | POA: Diagnosis not present

## 2020-11-23 DIAGNOSIS — R509 Fever, unspecified: Secondary | ICD-10-CM | POA: Diagnosis not present

## 2020-11-23 DIAGNOSIS — R051 Acute cough: Secondary | ICD-10-CM | POA: Diagnosis not present

## 2020-11-23 DIAGNOSIS — R0981 Nasal congestion: Secondary | ICD-10-CM | POA: Diagnosis not present

## 2020-11-23 DIAGNOSIS — Z03818 Encounter for observation for suspected exposure to other biological agents ruled out: Secondary | ICD-10-CM | POA: Diagnosis not present

## 2020-11-23 DIAGNOSIS — J069 Acute upper respiratory infection, unspecified: Secondary | ICD-10-CM | POA: Diagnosis not present

## 2020-12-18 DIAGNOSIS — Z23 Encounter for immunization: Secondary | ICD-10-CM | POA: Diagnosis not present

## 2021-05-26 ENCOUNTER — Other Ambulatory Visit: Payer: Self-pay | Admitting: Family Medicine

## 2021-05-26 DIAGNOSIS — Z1231 Encounter for screening mammogram for malignant neoplasm of breast: Secondary | ICD-10-CM

## 2021-06-07 ENCOUNTER — Ambulatory Visit
Admission: RE | Admit: 2021-06-07 | Discharge: 2021-06-07 | Disposition: A | Discharge status: Home / Self Care | Payer: Medicare HMO | Source: Ambulatory Visit | Attending: Family Medicine | Admitting: Family Medicine

## 2021-06-07 DIAGNOSIS — Z1231 Encounter for screening mammogram for malignant neoplasm of breast: Secondary | ICD-10-CM

## 2021-10-11 DIAGNOSIS — M109 Gout, unspecified: Secondary | ICD-10-CM | POA: Diagnosis not present

## 2021-10-11 DIAGNOSIS — Z0000 Encounter for general adult medical examination without abnormal findings: Secondary | ICD-10-CM | POA: Diagnosis not present

## 2021-10-11 DIAGNOSIS — I10 Essential (primary) hypertension: Secondary | ICD-10-CM | POA: Diagnosis not present

## 2021-10-11 DIAGNOSIS — N183 Chronic kidney disease, stage 3 unspecified: Secondary | ICD-10-CM | POA: Diagnosis not present

## 2021-10-11 DIAGNOSIS — E7800 Pure hypercholesterolemia, unspecified: Secondary | ICD-10-CM | POA: Diagnosis not present

## 2021-10-11 DIAGNOSIS — R69 Illness, unspecified: Secondary | ICD-10-CM | POA: Diagnosis not present

## 2021-10-11 DIAGNOSIS — E559 Vitamin D deficiency, unspecified: Secondary | ICD-10-CM | POA: Diagnosis not present

## 2021-12-30 DIAGNOSIS — Z961 Presence of intraocular lens: Secondary | ICD-10-CM | POA: Diagnosis not present

## 2021-12-30 DIAGNOSIS — H532 Diplopia: Secondary | ICD-10-CM | POA: Diagnosis not present

## 2021-12-30 DIAGNOSIS — H524 Presbyopia: Secondary | ICD-10-CM | POA: Diagnosis not present

## 2022-03-25 DIAGNOSIS — T6594XA Toxic effect of unspecified substance, undetermined, initial encounter: Secondary | ICD-10-CM | POA: Diagnosis not present

## 2022-05-01 ENCOUNTER — Other Ambulatory Visit: Payer: Self-pay | Admitting: Family Medicine

## 2022-05-01 DIAGNOSIS — Z1231 Encounter for screening mammogram for malignant neoplasm of breast: Secondary | ICD-10-CM

## 2022-06-19 ENCOUNTER — Ambulatory Visit
Admission: RE | Admit: 2022-06-19 | Discharge: 2022-06-19 | Disposition: A | Discharge status: Home / Self Care | Payer: Medicare HMO | Source: Ambulatory Visit | Attending: Family Medicine | Admitting: Family Medicine

## 2022-06-19 DIAGNOSIS — Z1231 Encounter for screening mammogram for malignant neoplasm of breast: Secondary | ICD-10-CM | POA: Diagnosis not present

## 2022-10-24 DIAGNOSIS — I10 Essential (primary) hypertension: Secondary | ICD-10-CM | POA: Diagnosis not present

## 2022-10-24 DIAGNOSIS — E7800 Pure hypercholesterolemia, unspecified: Secondary | ICD-10-CM | POA: Diagnosis not present

## 2022-10-24 DIAGNOSIS — M109 Gout, unspecified: Secondary | ICD-10-CM | POA: Diagnosis not present

## 2022-10-24 DIAGNOSIS — E559 Vitamin D deficiency, unspecified: Secondary | ICD-10-CM | POA: Diagnosis not present

## 2022-10-31 DIAGNOSIS — E7800 Pure hypercholesterolemia, unspecified: Secondary | ICD-10-CM | POA: Diagnosis not present

## 2022-10-31 DIAGNOSIS — R7301 Impaired fasting glucose: Secondary | ICD-10-CM | POA: Diagnosis not present

## 2022-10-31 DIAGNOSIS — Z23 Encounter for immunization: Secondary | ICD-10-CM | POA: Diagnosis not present

## 2022-10-31 DIAGNOSIS — F430 Acute stress reaction: Secondary | ICD-10-CM | POA: Diagnosis not present

## 2022-10-31 DIAGNOSIS — Z6841 Body Mass Index (BMI) 40.0 and over, adult: Secondary | ICD-10-CM | POA: Diagnosis not present

## 2022-10-31 DIAGNOSIS — I10 Essential (primary) hypertension: Secondary | ICD-10-CM | POA: Diagnosis not present

## 2022-10-31 DIAGNOSIS — M109 Gout, unspecified: Secondary | ICD-10-CM | POA: Diagnosis not present

## 2022-10-31 DIAGNOSIS — E039 Hypothyroidism, unspecified: Secondary | ICD-10-CM | POA: Diagnosis not present

## 2022-10-31 DIAGNOSIS — E559 Vitamin D deficiency, unspecified: Secondary | ICD-10-CM | POA: Diagnosis not present

## 2022-10-31 DIAGNOSIS — Z0000 Encounter for general adult medical examination without abnormal findings: Secondary | ICD-10-CM | POA: Diagnosis not present

## 2022-10-31 DIAGNOSIS — N1831 Chronic kidney disease, stage 3a: Secondary | ICD-10-CM | POA: Diagnosis not present

## 2022-11-16 ENCOUNTER — Encounter (HOSPITAL_BASED_OUTPATIENT_CLINIC_OR_DEPARTMENT_OTHER): Payer: Self-pay | Admitting: Obstetrics & Gynecology

## 2022-11-16 ENCOUNTER — Other Ambulatory Visit (HOSPITAL_COMMUNITY)
Admission: RE | Admit: 2022-11-16 | Discharge: 2022-11-16 | Disposition: A | Discharge status: Home / Self Care | Payer: Medicare HMO | Source: Ambulatory Visit | Attending: Obstetrics & Gynecology | Admitting: Obstetrics & Gynecology

## 2022-11-16 ENCOUNTER — Ambulatory Visit (HOSPITAL_BASED_OUTPATIENT_CLINIC_OR_DEPARTMENT_OTHER): Payer: Medicare HMO | Admitting: Obstetrics & Gynecology

## 2022-11-16 VITALS — BP 127/89 | HR 59 | Ht 64.0 in | Wt 257.6 lb

## 2022-11-16 DIAGNOSIS — Z780 Asymptomatic menopausal state: Secondary | ICD-10-CM

## 2022-11-16 DIAGNOSIS — Z124 Encounter for screening for malignant neoplasm of cervix: Secondary | ICD-10-CM | POA: Diagnosis not present

## 2022-11-16 DIAGNOSIS — K603 Anal fistula, unspecified: Secondary | ICD-10-CM

## 2022-11-16 DIAGNOSIS — Z01419 Encounter for gynecological examination (general) (routine) without abnormal findings: Secondary | ICD-10-CM | POA: Diagnosis not present

## 2022-11-16 DIAGNOSIS — E2839 Other primary ovarian failure: Secondary | ICD-10-CM

## 2022-11-16 NOTE — Progress Notes (Signed)
72 y.o. Doris French Divorced White or Caucasian female here for breast and pelvic exam.  Denies vaginal bleeding.  Has not had any rectal bleeding for the last three years.  Did have fistula repair with Dr. Maisie Fus in 12/2017.  Did have some bleeding after the repair but stopped baby ASA and this stopped.    Patient's last menstrual period was 02/20/2002 (approximate).          Sexually active: No.  H/O STD:  no  Health Maintenance: PCP:  Dr. Tenny Craw.  Last wellness appt was a couple of weeks ago.  Did blood work at that appt:  yes Vaccines are up to date:  Pt aware I do not have dates for all of her vaccines but she is sure they are up to date except tdap. Colonoscopy:  08/02/2017, follow up 5 years MMG:  06/19/2022 Negative BMD:  05/10/2015 Last pap smear:  08/31/2020 Negative.   H/o abnormal pap smear: no    reports that she has never smoked. She has never used smokeless tobacco. She reports that she does not drink alcohol and does not use drugs.  Past Medical History:  Diagnosis Date   Anal fistula    Anxiety    Balance disorder    due to right ear nerve damage (acoustic neuroma)   Chronic kidney disease (CKD), stage III (moderate) (HCC)    FOLLOWED BY PCP   Deafness in right ear    secondary to right side acoustic neuroma  s/p  resection 07/ 2010   Generalized weakness    LEGS   GERD (gastroesophageal reflux disease)    RARE   Gout    09-24-2017  STABLE,  LAST EPISODE 2018   Hyperlipidemia    Hypertension    IBS (irritable bowel syndrome)    Left ovarian cyst    per MRI 09-07-2017   Personal history of adenomatous colonic polyps    Right acoustic neuroma (HCC) 08/2008   08-20-2008   s/p  right retromastoid suboccipital craniectomy and excision right cerebellopontine angle tumor meatus   Seasonal allergies    Tremors of nervous system    FAMILIAL   Trigeminal neuralgia of left side of face    Urinary incontinence     Past Surgical History:  Procedure Laterality Date    CATARACT EXTRACTION W/ INTRAOCULAR LENS  IMPLANT, BILATERAL  06-26-2017;  07-03-2017   CESAREAN SECTION  1978   COLONOSCOPY WITH PROPOFOL  last one 08-02-2017   EYE SURGERY Bilateral 2019   CATARACT SURGERY BOTH EYES   FISTULOTOMY N/A 09/27/2017   Procedure: SETON PLACEMENT;  Surgeon: Romie Levee, MD;  Location: [facility];  Service: General;  Laterality: N/A;   LIGATION OF INTERNAL FISTULA TRACT N/A 12/21/2017   Procedure: LIGATION OF INTERNAL FISTULA TRACT;  Surgeon: Romie Levee, MD;  Location: [facility];  Service: General;  Laterality: N/A;   RIGHT SUBOCCIPITAL CRANIECTOMY GROSS TOTAL RESECTION OF TUMOR WITH DRILLING OF THE INTERNAL ACOUSTIC MEATUS AND EXCISION RIGHT CEREBELLPONTINE ANLE TUMOR, MICRODISSECTION , MEATUS  08-20-2008    dr elsner and dr Suszanne Conners   [facility]   right acoustic neuroma   WISDOM TOOTH EXTRACTION      Current Outpatient Medications  Medication Sig Dispense Refill   allopurinol (ZYLOPRIM) 300 MG tablet Take 300 mg by mouth every evening.   0   aspirin 81 MG tablet Take 81 mg by mouth daily.     clonazePAM (KLONOPIN) 0.5 MG tablet Take 0.5 mg by mouth 2 (  two) times daily as needed for anxiety.     furosemide (LASIX) 20 MG tablet Take 20 mg by mouth 2 (two) times daily as needed. Pt uses this prn only      irbesartan (AVAPRO) 150 MG tablet TAKE 1/2 TO 1 TABLET BY MOUTH ONCE A DAY---- PER PT TAKES 1/2 TABLET IN AM  4   levothyroxine (SYNTHROID) 50 MCG tablet Take 50 mcg by mouth daily before breakfast.     polyethylene glycol powder (GLYCOLAX/MIRALAX) 17 GM/SCOOP powder Take 1 Container by mouth daily as needed. 238 grams for colon 6-13      propranolol (INDERAL) 40 MG tablet TAKE 1 TABLET BY MOUTH ON AN EMPTY STOMACH TWICE A DAY X 30 DAYS----  TAKES TWICE DAILY  (takes essential tremors)  0   simvastatin (ZOCOR) 20 MG tablet Take 20 mg by mouth at bedtime.      Vitamin D, Ergocalciferol, (DRISDOL) 50000 UNITS CAPS Take 50,000 Units by mouth  every 7 (seven) days. TAKES ON FRIDAYS     Wheat Dextrin (BENEFIBER PO) Take by mouth.     No current facility-administered medications for this visit.    Family History  Problem Relation Age of Onset   Diabetes Mother    Heart disease Mother    Hypertension Mother    Prostate cancer Father        also non hodgkins lymphoma   Non-Hodgkin's lymphoma Father    Colon cancer Paternal Uncle    Testicular cancer Brother    Prostate cancer Brother        hx a fib   Breast cancer Sister    Heart disease Sister    Colon polyps Sister    Breast cancer Other        niece   Rectal cancer Neg Hx    Stomach cancer Neg Hx     Review of Systems  Constitutional: Negative.   Genitourinary: Negative.     Exam:   BP 127/89 (BP Location: Left Arm, Patient Position: Sitting, Cuff Size: Large)   Pulse (!) 59   Ht 5\' 4"  (1.626 m)   Wt 257 lb 9.6 oz (116.8 kg)   LMP 02/20/2002 (Approximate)   BMI 44.22 kg/m   Height: 5\' 4"  (162.6 cm)  General appearance: alert, cooperative and appears stated age Breasts: normal appearance, no masses or tenderness Abdomen: soft, non-tender; bowel sounds normal; no masses,  no organomegaly Lymph nodes: Cervical, supraclavicular, and axillary nodes normal.  No abnormal inguinal nodes palpated Neurologic: Grossly normal  Pelvic: External genitalia:  no lesions              Urethra:  normal appearing urethra with no masses, tenderness or lesions              Bartholins and Skenes: normal                 Vagina: normal appearing vagina with atrophic changes and no discharge, no lesions              Cervix: no lesions              Pap taken: Yes.   Bimanual Exam:  Uterus:  normal size, contour, position, consistency, mobility, non-tender              Adnexa: normal adnexa and no mass, fullness, tenderness               Rectovaginal: Confirms  Anus:  normal sphincter tone, no lesions, well healed fistulotomy scar present  Chaperone, Ina Homes, CMA, was present for exam.  Assessment/Plan: 1. Encntr for gyn exam (general) (routine) w/o abn findings - Pap smear obtained today - Mammogram 06/19/2022 - Colonoscopy 08/02/2017, follow up 5 years.  Pt knows to call [facility] for follow as is an established pt - Bone mineral density ordered - lab work done with PCP, Dr Tenny Craw - vaccines reviewed/updated  2. Hypoestrogenism - DG BONE DENSITY (DXA); Future  3. Cervical cancer screening - Cytology - PAP( [facility]) - PR OBTAINING SCREEN PAP SMEAR  4. Anal fistula - well healed scar present.  No evidence of recurrence.  No bleeding.  5. Postmenopausal - not on HRT

## 2022-11-20 ENCOUNTER — Encounter (HOSPITAL_BASED_OUTPATIENT_CLINIC_OR_DEPARTMENT_OTHER): Payer: Self-pay | Admitting: Obstetrics & Gynecology

## 2022-11-20 DIAGNOSIS — K603 Anal fistula, unspecified: Secondary | ICD-10-CM | POA: Insufficient documentation

## 2022-11-20 LAB — CYTOLOGY - PAP
Adequacy: ABSENT
Diagnosis: NEGATIVE

## 2022-11-22 ENCOUNTER — Encounter: Payer: Self-pay | Admitting: Internal Medicine

## 2022-12-25 ENCOUNTER — Ambulatory Visit (AMBULATORY_SURGERY_CENTER): Payer: Medicare HMO | Admitting: *Deleted

## 2022-12-25 VITALS — Ht 64.0 in | Wt 255.0 lb

## 2022-12-25 DIAGNOSIS — Z800 Family history of malignant neoplasm of digestive organs: Secondary | ICD-10-CM

## 2022-12-25 DIAGNOSIS — Z860100 Personal history of colon polyps, unspecified: Secondary | ICD-10-CM

## 2022-12-25 MED ORDER — ONDANSETRON HCL 4 MG PO TABS: ORAL_TABLET | ORAL | 0 refills | Status: DC

## 2022-12-25 MED ORDER — NA SULFATE-K SULFATE-MG SULF 17.5-3.13-1.6 GM/177ML PO SOLN: 1.0000 | Freq: Once | ORAL | 0 refills | Status: AC

## 2022-12-25 NOTE — Progress Notes (Signed)
No egg or soy allergy known to patient  No issues known to pt with past sedation with any surgeries or procedures Patient denies ever being told they had issues or difficulty with intubation  No FH of Malignant Hyperthermia Pt is not on diet pills Pt is not on  home 02  Pt is not on blood thinners  Pt denies issues with constipation  No A fib or A flutter Have any cardiac testing pending--no Pt instructed to use Single[employer] or GoodRx for a price reduction on prep    Pt.expressed that she might throw up,zofran given to take prior to each prep dose.  PT.GIVEN ZOFRAN 30-60 MINUTES PRIOR TO EACH PREP DOSE,INSTRUCTED TO CONTINUE TAKING MIRALAX  Patient's chart reviewed by Cathlyn Parsons CNRA prior to previsit and patient appropriate for the [facility].  Previsit completed and red dot placed by patient's name on their procedure day (on provider's schedule).

## 2023-01-01 ENCOUNTER — Encounter: Payer: Self-pay | Admitting: Internal Medicine

## 2023-01-08 NOTE — Progress Notes (Unsigned)
[facility] History and Physical   Primary Care Physician:  Daisy Floro, MD   Reason for Procedure:  History of colon polyps  Plan:    Colonoscopy     HPI: Doris French is a 71 y.o. female status post removal of adenomatous colonic polyps and also with a family history of colon cancer.  (Paternal uncle) 10/2011 - 2 adenomas max 7 mm  08/02/2017 diminutive ascending polyp adenoma  Past Medical History:  Diagnosis Date   Allergy    Anal fistula    Anxiety    Balance disorder    due to right ear nerve damage (acoustic neuroma)   Cataract    bilateral,removed   Chronic kidney disease (CKD), stage III (moderate) (HCC)    FOLLOWED BY PCP   Deafness in right ear    secondary to right side acoustic neuroma  s/p  resection 07/ 2010   Generalized weakness    LEGS   GERD (gastroesophageal reflux disease)    RARE   Gout    09-24-2017  STABLE,  LAST EPISODE 2018   Hyperlipidemia    Hypertension    IBS (irritable bowel syndrome)    Left ovarian cyst    per MRI 09-07-2017   Personal history of adenomatous colonic polyps    Right acoustic neuroma (HCC) 08/2008   08-20-2008   s/p  right retromastoid suboccipital craniectomy and excision right cerebellopontine angle tumor meatus   Seasonal allergies    Thyroid disease    hypo,updated11/4/24   Tremors of nervous system    FAMILIAL   Trigeminal neuralgia of left side of face    Urinary incontinence     Past Surgical History:  Procedure Laterality Date   CATARACT EXTRACTION W/ INTRAOCULAR LENS  IMPLANT, BILATERAL  06-26-2017;  07-03-2017   CESAREAN SECTION  1978   COLONOSCOPY WITH PROPOFOL  last one 08-02-2017   EYE SURGERY Bilateral 2019   CATARACT SURGERY BOTH EYES   FISTULOTOMY N/A 09/27/2017   Procedure: SETON PLACEMENT;  Surgeon: Romie Levee, MD;  Location: [facility];  Service: General;  Laterality: N/A;   LIGATION OF INTERNAL FISTULA TRACT N/A 12/21/2017   Procedure: LIGATION OF  INTERNAL FISTULA TRACT;  Surgeon: Romie Levee, MD;  Location: [facility];  Service: General;  Laterality: N/A;   MOUTH SURGERY     RIGHT SUBOCCIPITAL CRANIECTOMY GROSS TOTAL RESECTION OF TUMOR WITH DRILLING OF THE INTERNAL ACOUSTIC MEATUS AND EXCISION RIGHT CEREBELLPONTINE ANLE TUMOR, MICRODISSECTION , MEATUS  08-20-2008    dr elsner and dr Suszanne Conners   [facility]   right acoustic neuroma   WISDOM TOOTH EXTRACTION      Prior to Admission medications   Medication Sig Start Date End Date Taking? Authorizing Provider  allopurinol (ZYLOPRIM) 300 MG tablet Take 300 mg by mouth every evening.  05/14/17   [provider]  clonazePAM (KLONOPIN) 0.5 MG tablet Take 0.5 mg by mouth 2 (two) times daily as needed for anxiety.    [provider]  furosemide (LASIX) 20 MG tablet Take 20 mg by mouth 2 (two) times daily as needed. Pt uses this prn only     [provider]  irbesartan (AVAPRO) 150 MG tablet TAKE 1/2 TO 1 TABLET BY MOUTH ONCE A DAY---- PER PT TAKES 1/2 TABLET IN AM 07/12/17   [provider]  levothyroxine (SYNTHROID) 50 MCG tablet Take 50 mcg by mouth daily before breakfast.    [provider]  ondansetron (ZOFRAN) 4 MG  tablet TAKE 30-60 MINUTES PRIOR TO EACH PREP DOSE 12/25/22   Iva Boop, MD  polyethylene glycol powder (GLYCOLAX/MIRALAX) 17 GM/SCOOP powder Take 1 Container by mouth daily as needed. 238 grams for colon 6-13     [provider]  propranolol (INDERAL) 40 MG tablet TAKE 1 TABLET BY MOUTH ON AN EMPTY STOMACH TWICE A DAY X 30 DAYS----  TAKES TWICE DAILY  (takes essential tremors) 06/29/17   [provider]  simvastatin (ZOCOR) 20 MG tablet Take 20 mg by mouth at bedtime.     [provider]  Vitamin D, Ergocalciferol, (DRISDOL) 50000 UNITS CAPS Take 50,000 Units by mouth every 7 (seven) days. TAKES ON FRIDAYS    [provider]  Wheat Dextrin (BENEFIBER PO) Take by mouth. Patient not taking:  Reported on 12/25/2022    [provider]    Current Outpatient Medications  Medication Sig Dispense Refill   allopurinol (ZYLOPRIM) 300 MG tablet Take 300 mg by mouth every evening.   0   clonazePAM (KLONOPIN) 0.5 MG tablet Take 0.5 mg by mouth 2 (two) times daily as needed for anxiety.     furosemide (LASIX) 20 MG tablet Take 20 mg by mouth 2 (two) times daily as needed. Pt uses this prn only      irbesartan (AVAPRO) 150 MG tablet TAKE 1/2 TO 1 TABLET BY MOUTH ONCE A DAY---- PER PT TAKES 1/2 TABLET IN AM  4   levothyroxine (SYNTHROID) 50 MCG tablet Take 50 mcg by mouth daily before breakfast.     ondansetron (ZOFRAN) 4 MG tablet TAKE 30-60 MINUTES PRIOR TO EACH PREP DOSE 2 tablet 0   polyethylene glycol powder (GLYCOLAX/MIRALAX) 17 GM/SCOOP powder Take 1 Container by mouth daily as needed. 238 grams for colon 6-13      propranolol (INDERAL) 40 MG tablet TAKE 1 TABLET BY MOUTH ON AN EMPTY STOMACH TWICE A DAY X 30 DAYS----  TAKES TWICE DAILY  (takes essential tremors)  0   simvastatin (ZOCOR) 20 MG tablet Take 20 mg by mouth at bedtime.      Vitamin D, Ergocalciferol, (DRISDOL) 50000 UNITS CAPS Take 50,000 Units by mouth every 7 (seven) days. TAKES ON FRIDAYS     Wheat Dextrin (BENEFIBER PO) Take by mouth. (Patient not taking: Reported on 12/25/2022)     No current facility-administered medications for this visit.    Allergies as of 01/09/2023 - Review Complete 12/25/2022  Allergen Reaction Noted   Shellfish allergy Anaphylaxis 11/06/2011   Adhesive [tape] Other (See Comments) 09/24/2017   Other Hives 11/06/2011    Family History  Problem Relation Age of Onset   Diabetes Mother    Heart disease Mother    Hypertension Mother    Prostate cancer Father        also non hodgkins lymphoma   Non-Hodgkin's lymphoma Father    Breast cancer Sister    Heart disease Sister    Colon polyps Sister    Testicular cancer Brother    Prostate cancer Brother        hx a fib   Colon cancer  Paternal Uncle    Breast cancer Other        niece   Rectal cancer Neg Hx    Stomach cancer Neg Hx    Crohn's disease Neg Hx    Esophageal cancer Neg Hx    Ulcerative colitis Neg Hx     Social History   Socioeconomic History   Marital status: Divorced  Spouse name: Not on file   Number of children: Not on file   Years of education: Not on file   Highest education level: Not on file  Occupational History   Not on file  Tobacco Use   Smoking status: Never   Smokeless tobacco: Never  Vaping Use   Vaping status: Never Used  Substance and Sexual Activity   Alcohol use: No   Drug use: No   Sexual activity: Not Currently    Birth control/protection: Post-menopausal  Other Topics Concern   Not on file  Social History Narrative   Not on file   Social Determinants of Health   Financial Resource Strain: Not on file  Food Insecurity: Not on file  Transportation Needs: Not on file  Physical Activity: Not on file  Stress: Not on file  Social Connections: Not on file  Intimate Partner Violence: Not on file    Review of Systems: Positive for *** All other review of systems negative except as mentioned in the HPI.  Physical Exam: Vital signs LMP 02/20/2002 (Approximate)   General:   Alert,  Well-developed, well-nourished, pleasant and cooperative in NAD Lungs:  Clear throughout to auscultation.   Heart:  Regular rate and rhythm; no murmurs, clicks, rubs,  or gallops. Abdomen:  Soft, nontender and nondistended. Normal bowel sounds.   Neuro/Psych:  Alert and cooperative. Normal mood and affect. A and O x 3   @Willisha Sligar  Sena Slate, MD, [facility] [phone] (pager) 01/08/2023 5:43 PM@

## 2023-01-09 ENCOUNTER — Ambulatory Visit: Payer: Medicare HMO | Admitting: Internal Medicine

## 2023-01-09 ENCOUNTER — Encounter: Payer: Self-pay | Admitting: Internal Medicine

## 2023-01-09 VITALS — BP 137/65 | HR 64 | Temp 97.2°F | Resp 21 | Ht 64.0 in | Wt 255.0 lb

## 2023-01-09 DIAGNOSIS — K5730 Diverticulosis of large intestine without perforation or abscess without bleeding: Secondary | ICD-10-CM | POA: Diagnosis not present

## 2023-01-09 DIAGNOSIS — E785 Hyperlipidemia, unspecified: Secondary | ICD-10-CM | POA: Diagnosis not present

## 2023-01-09 DIAGNOSIS — Z1211 Encounter for screening for malignant neoplasm of colon: Secondary | ICD-10-CM

## 2023-01-09 DIAGNOSIS — D123 Benign neoplasm of transverse colon: Secondary | ICD-10-CM

## 2023-01-09 DIAGNOSIS — D122 Benign neoplasm of ascending colon: Secondary | ICD-10-CM | POA: Diagnosis not present

## 2023-01-09 DIAGNOSIS — Z860101 Personal history of adenomatous and serrated colon polyps: Secondary | ICD-10-CM

## 2023-01-09 DIAGNOSIS — Z800 Family history of malignant neoplasm of digestive organs: Secondary | ICD-10-CM

## 2023-01-09 DIAGNOSIS — D120 Benign neoplasm of cecum: Secondary | ICD-10-CM | POA: Diagnosis not present

## 2023-01-09 DIAGNOSIS — Z860100 Personal history of colon polyps, unspecified: Secondary | ICD-10-CM

## 2023-01-09 DIAGNOSIS — F419 Anxiety disorder, unspecified: Secondary | ICD-10-CM | POA: Diagnosis not present

## 2023-01-09 DIAGNOSIS — I10 Essential (primary) hypertension: Secondary | ICD-10-CM | POA: Diagnosis not present

## 2023-01-09 MED ORDER — SODIUM CHLORIDE 0.9 % IV SOLN: 500.0000 mL | Freq: Once | INTRAVENOUS | Status: DC

## 2023-01-09 NOTE — Progress Notes (Signed)
Called to room to assist during endoscopic procedure.  Patient ID and intended procedure confirmed with present staff. Received instructions for my participation in the procedure from the performing physician.  

## 2023-01-09 NOTE — Progress Notes (Signed)
Report to PACU, RN, vss, BBS= Clear.  

## 2023-01-09 NOTE — Op Note (Signed)
[facility] Patient Name: Doris French Procedure Date: 01/09/2023 10:42 AM MRN: [id number] Endoscopist: Iva Boop , MD, [id number] Age: 72 Referring MD:  Date of Birth: Jun 23, 1950 Gender: Female Account #: [id number] Procedure:                Colonoscopy Indications:              Surveillance: Personal history of adenomatous                            polyps on last colonoscopy 5 years ago, Last                            colonoscopy: 2019 Medicines:                Monitored Anesthesia Care Procedure:                Pre-Anesthesia Assessment:                           - Prior to the procedure, a History and Physical                            was performed, and patient medications and                            allergies were reviewed. The patient's tolerance of                            previous anesthesia was also reviewed. The risks                            and benefits of the procedure and the sedation                            options and risks were discussed with the patient.                            All questions were answered, and informed consent                            was obtained. Prior Anticoagulants: The patient has                            taken no anticoagulant or antiplatelet agents. ASA                            Grade Assessment: III - A patient with severe                            systemic disease. After reviewing the risks and                            benefits, the patient was deemed in satisfactory  condition to undergo the procedure.                           After obtaining informed consent, the colonoscope                            was passed under direct vision. Throughout the                            procedure, the patient's blood pressure, pulse, and                            oxygen saturations were monitored continuously. The                            Olympus CF-HQ190L ([id number]) Colonoscope was                             introduced through the anus and advanced to the the                            cecum, identified by appendiceal orifice and                            ileocecal valve. The colonoscopy was performed                            without difficulty. The patient tolerated the                            procedure well. The quality of the bowel                            preparation was good. The ileocecal valve,                            appendiceal orifice, and rectum were photographed.                            The bowel preparation used was SUPREP via split                            dose instruction. Scope In: 10:59:16 AM Scope Out: 11:18:30 AM Scope Withdrawal Time: 0 hours 15 minutes 42 seconds  Total Procedure Duration: 0 hours 19 minutes 14 seconds  Findings:                 The perianal and digital rectal examinations were                            normal.                           Two sessile polyps were found in the cecum and  ileocecal valve. The polyps were 1 to 2 mm in size.                            These polyps were removed with a cold biopsy                            forceps. Resection and retrieval were complete.                            Verification of patient identification for the                            specimen was done. Estimated blood loss was minimal.                           Four sessile polyps were found in the transverse                            colon and ascending colon. The polyps were 3 to 5                            mm in size. These polyps were removed with a cold                            snare. Resection and retrieval were complete.                            Verification of patient identification for the                            specimen was done. Estimated blood loss was minimal.                           Multiple diverticula were found in the left colon.                           The  exam was otherwise without abnormality on                            direct and retroflexion views. Complications:            No immediate complications. Estimated Blood Loss:     Estimated blood loss was minimal. Impression:               - Two 1 to 2 mm polyps in the cecum and at the                            ileocecal valve, removed with a cold biopsy                            forceps. Resected and retrieved.                           -  Four 3 to 5 mm polyps in the transverse colon and                            in the ascending colon, removed with a cold snare.                            Resected and retrieved.                           - Diverticulosis in the left colon.                           - The examination was otherwise normal on direct                            and retroflexion views.                           - Personal history of colonic polyps. 10/2011 - 2                            adenomas max 7 mm                           08/02/2017 diminutive ascending polyp adenoma Recommendation:           - Patient has a contact number available for                            emergencies. The signs and symptoms of potential                            delayed complications were discussed with the                            patient. Return to normal activities tomorrow.                            Written discharge instructions were provided to the                            patient.                           - Resume previous diet.                           - Continue present medications.                           - Repeat colonoscopy is recommended for                            surveillance. The colonoscopy date will be  determined after pathology results from today's                            exam become available for review. Iva Boop, MD 01/09/2023 11:29:46 AM This report has been signed electronically.

## 2023-01-09 NOTE — Patient Instructions (Addendum)
Six tiny polyps removed - all look benign.  You also have a condition called diverticulosis - common and not usually a problem. Please read the handout provided.  I will let you know pathology results and when to have another routine colonoscopy by mail and/or My Chart.  I appreciate the opportunity to care for you. Iva Boop, MD, [facility]    Resume all of your previous medications today as ordered.  Read all of your discharge instructions.  YOU HAD AN ENDOSCOPIC PROCEDURE TODAY AT THE [facility]:   Refer to the procedure report that was given to you for any specific questions about what was found during the examination.  If the procedure report does not answer your questions, please call your gastroenterologist to clarify.  If you requested that your care partner not be given the details of your procedure findings, then the procedure report has been included in a sealed envelope for you to review at your convenience later.  YOU SHOULD EXPECT: Some feelings of bloating in the abdomen. Passage of more gas than usual.  Walking can help get rid of the air that was put into your GI tract during the procedure and reduce the bloating. If you had a lower endoscopy (such as a colonoscopy or flexible sigmoidoscopy) you may notice spotting of blood in your stool or on the toilet paper. If you underwent a bowel prep for your procedure, you may not have a normal bowel movement for a few days.  Please Note:  You might notice some irritation and congestion in your nose or some drainage.  This is from the oxygen used during your procedure.  There is no need for concern and it should clear up in a day or so.  SYMPTOMS TO REPORT IMMEDIATELY:  Following lower endoscopy (colonoscopy or flexible sigmoidoscopy):  Excessive amounts of blood in the stool  Significant tenderness or worsening of abdominal pains  Swelling of the abdomen that is new, acute  Fever of 100F or higher   For urgent or  emergent issues, a gastroenterologist can be reached at any hour by calling [phone]. Do not use MyChart messaging for urgent concerns.    DIET:  We do recommend a small meal at first, but then you may proceed to your regular diet.  Drink plenty of fluids but you should avoid alcoholic beverages for 24 hours.  ACTIVITY:  You should plan to take it easy for the rest of today and you should NOT DRIVE or use heavy machinery until tomorrow (because of the sedation medicines used during the test).    FOLLOW UP: Our staff will call the number listed on your records the next business day following your procedure.  We will call around 7:15- 8:00 am to check on you and address any questions or concerns that you may have regarding the information given to you following your procedure. If we do not reach you, we will leave a message.     If any biopsies were taken you will be contacted by phone or by letter within the next 1-3 weeks.  Please call us at [phone] if you have not heard about the biopsies in 3 weeks.    SIGNATURES/CONFIDENTIALITY: You and/or your care partner have signed paperwork which will be entered into your electronic medical record.  These signatures attest to the fact that that the information above on your After Visit Summary has been reviewed and is understood.  Full responsibility of the confidentiality of this  discharge information lies with you and/or your care-partner.

## 2023-01-09 NOTE — Progress Notes (Signed)
Pt's states no medical or surgical changes since previsit or office visit. 

## 2023-01-10 ENCOUNTER — Telehealth: Payer: Self-pay

## 2023-01-10 NOTE — Telephone Encounter (Signed)
  Follow up Call-     01/09/2023   10:32 AM  Call back number  Post procedure Call Back phone  # [phone]  Permission to leave phone message Yes     Patient questions:  Do you have a fever, pain , or abdominal swelling? No. Pain Score  0 *  Have you tolerated food without any problems? Yes.    Have you been able to return to your normal activities? Yes.    Do you have any questions about your discharge instructions: Diet   No. Medications  No. Follow up visit  No.  Do you have questions or concerns about your Care? No.  Actions: * If pain score is 4 or above: No action needed, pain <4.

## 2023-01-11 LAB — SURGICAL PATHOLOGY

## 2023-01-16 ENCOUNTER — Encounter: Payer: Self-pay | Admitting: Internal Medicine

## 2023-01-16 DIAGNOSIS — Z860100 Personal history of colon polyps, unspecified: Secondary | ICD-10-CM

## 2023-02-20 DIAGNOSIS — H04123 Dry eye syndrome of bilateral lacrimal glands: Secondary | ICD-10-CM | POA: Diagnosis not present

## 2023-02-20 DIAGNOSIS — H26493 Other secondary cataract, bilateral: Secondary | ICD-10-CM | POA: Diagnosis not present

## 2023-02-20 DIAGNOSIS — H5213 Myopia, bilateral: Secondary | ICD-10-CM | POA: Diagnosis not present

## 2023-02-20 DIAGNOSIS — H43813 Vitreous degeneration, bilateral: Secondary | ICD-10-CM | POA: Diagnosis not present

## 2023-05-21 ENCOUNTER — Other Ambulatory Visit: Payer: Self-pay | Admitting: Family Medicine

## 2023-05-21 DIAGNOSIS — Z1231 Encounter for screening mammogram for malignant neoplasm of breast: Secondary | ICD-10-CM

## 2023-06-21 ENCOUNTER — Ambulatory Visit
Admission: RE | Admit: 2023-06-21 | Discharge: 2023-06-21 | Disposition: A | Discharge status: Home / Self Care | Source: Ambulatory Visit | Attending: Family Medicine | Admitting: Family Medicine

## 2023-06-21 DIAGNOSIS — Z1231 Encounter for screening mammogram for malignant neoplasm of breast: Secondary | ICD-10-CM | POA: Diagnosis not present

## 2023-07-05 DIAGNOSIS — H43811 Vitreous degeneration, right eye: Secondary | ICD-10-CM | POA: Diagnosis not present

## 2023-11-26 ENCOUNTER — Ambulatory Visit (HOSPITAL_BASED_OUTPATIENT_CLINIC_OR_DEPARTMENT_OTHER): Payer: Medicare HMO | Admitting: Obstetrics & Gynecology

## 2023-11-26 ENCOUNTER — Encounter (HOSPITAL_BASED_OUTPATIENT_CLINIC_OR_DEPARTMENT_OTHER): Payer: Self-pay | Admitting: Obstetrics & Gynecology

## 2023-11-26 VITALS — BP 138/74 | HR 62 | Wt 257.0 lb

## 2023-11-26 DIAGNOSIS — Z01419 Encounter for gynecological examination (general) (routine) without abnormal findings: Secondary | ICD-10-CM

## 2023-11-26 DIAGNOSIS — N816 Rectocele: Secondary | ICD-10-CM

## 2023-11-26 DIAGNOSIS — K603 Anal fistula, unspecified: Secondary | ICD-10-CM | POA: Diagnosis not present

## 2023-11-26 DIAGNOSIS — Z780 Asymptomatic menopausal state: Secondary | ICD-10-CM | POA: Diagnosis not present

## 2023-11-26 DIAGNOSIS — E2839 Other primary ovarian failure: Secondary | ICD-10-CM

## 2023-11-26 NOTE — Progress Notes (Signed)
 Breast and Pelvic Exam Patient name: Doris French MRN [id number]  Date of birth: [birth year] Chief Complaint:   Gynecologic Exam (Pt reports no issues or concerns today.)  History of Present Illness:   Doris French is a 73 y.o. G28P1011 Caucasian female being seen today for breast and pelvic exam.  No complaints today.  No new rectal issues.  No vaginal bleeding.   Has not gotten results from Dr. Okey' office.  I could see lab work and reviewed with her today.    Patient's last menstrual period was 02/20/2002 (approximate).   Last pap 11/16/2022. Results were: NILM w/ HRHPV not done. H/O abnormal pap: no Last mammogram: 06/21/2023. Results were: normal. Family h/o breast cancer: yes sister, paternal aunts and niece. Last colonoscopy: 01/09/2023. Results were: abnormal six polyps. Family h/o colorectal cancer: yes paternal uncles.  Follow up 3 years.     11/26/2023   10:06 AM 11/16/2022   11:27 AM  Depression screen PHQ 2/9  Decreased Interest 0 0  Down, Depressed, Hopeless 0 0  PHQ - 2 Score 0 0    Review of Systems:   Pertinent items are noted in HPI Denies any bowel or urinary changes.  Denies pelvic pain.  Pertinent History Reviewed:  Reviewed past medical,surgical, social and family history.  Reviewed problem list, medications and allergies. Physical Assessment:   Vitals:   11/26/23 1003  BP: 138/74  Pulse: 62  SpO2: 100%  Weight: 257 lb (116.6 kg)  Body mass index is 44.11 kg/m.        Physical Examination:   General appearance - well appearing, and in no distress  Mental status - alert, oriented to person, place, and time  Psych:  She has a normal mood and affect  Skin - warm and dry, normal color, no suspicious lesions noted  Chest - effort normal, all lung fields clear to auscultation bilaterally  Heart - normal rate and regular rhythm  Neck:  midline trachea, no thyromegaly or nodules  Breasts - breasts appear normal, no suspicious masses, no skin or  nipple changes or  axillary nodes  Abdomen - soft, nontender, nondistended, no masses or organomegaly  Pelvic - VULVA: normal appearing vulva with no masses, tenderness or lesions   VAGINA: normal appearing vagina with normal color and discharge, no lesions, second degree rectocele stable on exam  CERVIX: normal appearing cervix without discharge or lesions, no CMT  Thin prep pap is not indicated today  UTERUS: uterus is felt to be normal size, shape, consistency and nontender   ADNEXA: No adnexal masses or tenderness noted.  Rectal - normal rectal, good sphincter tone, no masses felt.   Extremities:  No swelling or varicosities noted  Chaperone present for exam  No results found for this or any previous visit (from the past 24 hours).  Assessment & Plan:  1. Encntr for gyn exam (general) (routine) w/o abn findings (Primary) - Pap smear neg 2024 - Mammogram 06/25/2023 - Colonoscopy 2024.  Follow up 3 years. - Bone mineral density 2017.  Ordered last year.  She did not do but will order again.   - lab work done with PCP, Dr. Okey - vaccines reviewed/updated  2. Postmenopausal  3. Hypoestrogenism - DG Bone Density; Future - on Vit D 50K weekly.    4. Anal fistula - repaired with Dr. Debby.  No new issues.    5. Rectocele - stable on exam   Orders Placed This Encounter  Procedures  DG Bone Density    Meds: No orders of the defined types were placed in this encounter.   Follow-up: No follow-ups on file.  Ronal GORMAN Pinal, MD 11/26/2023 10:46 AM

## 2024-03-25 ENCOUNTER — Other Ambulatory Visit: Payer: Self-pay | Admitting: Obstetrics & Gynecology

## 2024-03-25 DIAGNOSIS — Z1231 Encounter for screening mammogram for malignant neoplasm of breast: Secondary | ICD-10-CM

## 2024-06-24 ENCOUNTER — Ambulatory Visit

## 2024-07-10 ENCOUNTER — Other Ambulatory Visit (HOSPITAL_BASED_OUTPATIENT_CLINIC_OR_DEPARTMENT_OTHER)
